# Patient Record
Sex: Male | Born: 1965 | Race: Black or African American | Hispanic: No | Smoking: Current every day smoker
Health system: Southern US, Community
[De-identification: ages and names within clinical notes are randomized; demographics above are authoritative.]

## PROBLEM LIST (undated history)

## (undated) DIAGNOSIS — I10 Essential (primary) hypertension: Secondary | ICD-10-CM

## (undated) HISTORY — DX: Essential (primary) hypertension: I10

## (undated) HISTORY — PX: ARM DEBRIDEMENT: SHX890

---

## 1999-06-06 ENCOUNTER — Encounter: Payer: Self-pay | Admitting: Emergency Medicine

## 1999-06-06 ENCOUNTER — Emergency Department (HOSPITAL_COMMUNITY): Admission: EM | Admit: 1999-06-06 | Discharge: 1999-06-06 | Payer: Self-pay | Admitting: Emergency Medicine

## 2007-04-15 ENCOUNTER — Emergency Department (HOSPITAL_COMMUNITY): Admission: EM | Admit: 2007-04-15 | Discharge: 2007-04-16 | Payer: Self-pay | Admitting: Emergency Medicine

## 2012-01-07 ENCOUNTER — Ambulatory Visit (INDEPENDENT_AMBULATORY_CARE_PROVIDER_SITE_OTHER): Payer: BC Managed Care – PPO

## 2012-01-07 DIAGNOSIS — R1031 Right lower quadrant pain: Secondary | ICD-10-CM

## 2012-01-07 DIAGNOSIS — R1110 Vomiting, unspecified: Secondary | ICD-10-CM

## 2012-12-21 ENCOUNTER — Emergency Department (HOSPITAL_COMMUNITY)
Admission: EM | Admit: 2012-12-21 | Discharge: 2012-12-21 | Disposition: A | Discharge status: Home / Self Care | Payer: BC Managed Care – PPO | Attending: Emergency Medicine | Admitting: Emergency Medicine

## 2012-12-21 ENCOUNTER — Emergency Department (HOSPITAL_COMMUNITY): Payer: BC Managed Care – PPO

## 2012-12-21 ENCOUNTER — Encounter (HOSPITAL_COMMUNITY): Payer: Self-pay | Admitting: Emergency Medicine

## 2012-12-21 DIAGNOSIS — N201 Calculus of ureter: Secondary | ICD-10-CM | POA: Insufficient documentation

## 2012-12-21 DIAGNOSIS — R1031 Right lower quadrant pain: Secondary | ICD-10-CM | POA: Insufficient documentation

## 2012-12-21 DIAGNOSIS — Z79899 Other long term (current) drug therapy: Secondary | ICD-10-CM | POA: Insufficient documentation

## 2012-12-21 DIAGNOSIS — M545 Low back pain, unspecified: Secondary | ICD-10-CM | POA: Insufficient documentation

## 2012-12-21 DIAGNOSIS — F17200 Nicotine dependence, unspecified, uncomplicated: Secondary | ICD-10-CM | POA: Insufficient documentation

## 2012-12-21 DIAGNOSIS — R109 Unspecified abdominal pain: Secondary | ICD-10-CM

## 2012-12-21 LAB — CBC
HCT: 45.3 % (ref 39.0–52.0)
Hemoglobin: 16 g/dL (ref 13.0–17.0)
MCH: 30.9 pg (ref 26.0–34.0)
MCHC: 35.3 g/dL (ref 30.0–36.0)
MCV: 87.6 fL (ref 78.0–100.0)
Platelets: 152 10*3/uL (ref 150–400)
RBC: 5.17 MIL/uL (ref 4.22–5.81)
RDW: 12.4 % (ref 11.5–15.5)
WBC: 7.3 10*3/uL (ref 4.0–10.5)

## 2012-12-21 LAB — BASIC METABOLIC PANEL
BUN: 14 mg/dL (ref 6–23)
CO2: 30 mEq/L (ref 19–32)
Calcium: 9.5 mg/dL (ref 8.4–10.5)
Chloride: 104 mEq/L (ref 96–112)
Creatinine, Ser: 1.24 mg/dL (ref 0.50–1.35)
GFR calc Af Amer: 79 mL/min — ABNORMAL LOW (ref >90)
GFR calc non Af Amer: 68 mL/min — ABNORMAL LOW (ref >90)
Glucose, Bld: 119 mg/dL — ABNORMAL HIGH (ref 70–99)
Potassium: 3.8 mEq/L (ref 3.5–5.1)
Sodium: 141 mEq/L (ref 135–145)

## 2012-12-21 MED ORDER — KETOROLAC TROMETHAMINE 15 MG/ML IJ SOLN
15.0000 mg | Freq: Once | INTRAMUSCULAR | Status: DC
  Filled 2012-12-21: qty 1

## 2012-12-21 MED ORDER — HYDROMORPHONE HCL PF 1 MG/ML IJ SOLN
1.0000 mg | Freq: Once | INTRAMUSCULAR | Status: AC
  Administered 2012-12-21: 1 mg via INTRAVENOUS
  Filled 2012-12-21: qty 1

## 2012-12-21 MED ORDER — SODIUM CHLORIDE 0.9 % IV BOLUS (SEPSIS)
1000.0000 mL | Freq: Once | INTRAVENOUS | Status: AC
  Administered 2012-12-21: 1000 mL via INTRAVENOUS

## 2012-12-21 MED ORDER — ONDANSETRON HCL 4 MG/2ML IJ SOLN
4.0000 mg | Freq: Once | INTRAMUSCULAR | Status: AC
  Administered 2012-12-21: 4 mg via INTRAVENOUS
  Filled 2012-12-21: qty 2

## 2012-12-21 MED ORDER — OXYCODONE-ACETAMINOPHEN 5-325 MG PO TABS: 1.0000 | ORAL_TABLET | ORAL | Status: DC | PRN

## 2012-12-21 MED ORDER — TAMSULOSIN HCL 0.4 MG PO CAPS: 0.4000 mg | ORAL_CAPSULE | Freq: Every day | ORAL | Status: DC

## 2012-12-21 NOTE — ED Notes (Signed)
Patient transported to CT 

## 2012-12-21 NOTE — ED Provider Notes (Signed)
History    46 year old male with abdominal pain. Patient noticed right lower back pain when he woke up this morning around 0400. Progress to his right lower quadrant. Patient describes the pain as it as someone is stabbing him. Initially very severe and associated with diaphoresis. Currently feels better but still has some mild ache in his right or quadrant. No appreciable exacerbating relieving factors. No testicular pain or swelling. No urinary complaints. No history similar symptoms. No history of abdominal surgery. No fevers or chills. Patient went to bed last night he was in his usual state of health.  CSN: [id number]  Arrival date & time 12/21/12  0901   First MD Initiated Contact with Patient 12/21/12 [phone]      Chief Complaint  Patient presents with  . Abdominal Pain    (Consider location/radiation/quality/duration/timing/severity/associated sxs/prior treatment) HPI  History reviewed. No pertinent past medical history.  History reviewed. No pertinent past surgical history.  No family history on file.  History  Substance Use Topics  . Smoking status: Current Every Day Smoker  . Smokeless tobacco: Not on file  . Alcohol Use: Yes     Comment: ocassionally       Review of Systems  All systems reviewed and negative, other than as noted in HPI.   Allergies  Review of patient's allergies indicates no known allergies.  Home Medications   Current Outpatient Rx  Name  Route  Sig  Dispense  Refill  . L-ARGININE PO   Oral   Take 1 tablet by mouth daily.         Marland Kitchen LISINOPRIL-HYDROCHLOROTHIAZIDE 20-12.5 MG PO TABS   Oral   Take 1 tablet by mouth daily.         . ADULT MULTIVITAMIN W/MINERALS CH   Oral   Take 1 tablet by mouth daily.         Marland Kitchen NAPROXEN SODIUM 220 MG PO TABS   Oral   Take 660 mg by mouth 2 (two) times daily as needed. For pain.           BP 133/77  Pulse 64  Temp 98.4 F (36.9 C) (Oral)  Resp 18  SpO2 99%  Physical Exam  Nursing  note and vitals reviewed. Constitutional: He appears well-developed and well-nourished. No distress.       Laying in bed. No acute distress  HENT:  Head: Normocephalic and atraumatic.  Eyes: Conjunctivae normal are normal. Right eye exhibits no discharge. Left eye exhibits no discharge.  Neck: Neck supple.  Cardiovascular: Normal rate, regular rhythm and normal heart sounds.  Exam reveals no gallop and no friction rub.   No murmur heard. Pulmonary/Chest: Effort normal and breath sounds normal. No respiratory distress.  Abdominal: Soft. He exhibits no distension. There is tenderness.       Abdomen normal to inspection. Mild tenderness in the right lower quadrant without rebound or guarding. No distention  Genitourinary:       No costovertebral angle tenderness  Musculoskeletal: He exhibits no edema and no tenderness.  Neurological: He is alert.  Skin: Skin is warm and dry.  Psychiatric: He has a normal mood and affect. His behavior is normal. Thought content normal.    ED Course  Procedures (including critical care time)   Labs Reviewed  CBC  URINALYSIS, ROUTINE W REFLEX MICROSCOPIC  BASIC METABOLIC PANEL   Ct Abdomen Pelvis Wo Contrast  12/21/2012  *RADIOLOGY REPORT*  Clinical Data: Right flank pain, right lower quadrant pain  CT ABDOMEN  AND PELVIS WITHOUT CONTRAST  Technique:  Multidetector CT imaging of the abdomen and pelvis was performed following the standard protocol without intravenous contrast.  Comparison: None.  Findings: Sagittal images of the spine shows no destructive bony lesions.  Lung bases are unremarkable.  Unenhanced liver shows no biliary ductal dilatation.  No calcified gallstones are noted within gallbladder.  Unenhanced pancreas, spleen and adrenal glands are unremarkable.  Unenhanced kidneys are symmetrical in size.  There is subtle mild right perinephric stranding.  No hydronephrosis or hydroureter.  No proximal or mid calcified ureteral calculi are noted.  No  aortic aneurysm.  No small bowel obstruction.  No ascites or free air.  No adenopathy.  No pericecal inflammation.  Normal appendix is clearly visualized axial image 56.  There is mild distended distal right ureter.  In axial image 78 there is 2 mm calcified calculus in the right UVJ.  Prostate gland calcifications are noted.  Prostate gland measures 2.6 x 4 cm.  No destructive bony lesions are noted within pelvis.  No calcified calculi are noted within urinary bladder.  IMPRESSION:  1.  No nephrolithiasis. 2.  Subtle mild right perinephric stranding.  There is 2 mm calcified calculus in the right UVJ. 3.  No pericecal inflammation.  Normal appendix. 4.  No small bowel obstruction.   Original Report Authenticated By: Natasha Mead, M.D.      1. Ureteral calculus   2. Abdominal pain       MDM  46 year old male with right-sided abdominal pain. History provided is consistent with a ureteral stone. Patient does have some tenderness in his right lower quadrant though. Consider appendicitis. Pain CT abdomen and pelvis.BAsic labs and urinalysis. Will treat symptoms and reassess.  CT scan shows a 2 mm right ureteral stone at the UVJ. Patient reports symptoms are much improved. Repeat abdominal exam prior to discharge shows no tenderness. Patient is afebrile. He has not been vomiting. Renal function is normal. Is appropriate for discharge at this time. Emergent return precautions were discussed. Outpatient followup otherwise        Raeford Razor, MD 12/21/12 1038

## 2012-12-21 NOTE — ED Notes (Signed)
Per EMS: Woke up around 0400  Sharp right lower quadrant abdominal pain, denies n/v/d.

## 2012-12-21 NOTE — ED Notes (Signed)
RN to obtain labs with start of IV 

## 2012-12-21 NOTE — ED Notes (Signed)
WUJ:[id number]<JY> Expected date:12/21/12<BR> Expected time: 8:45 AM<BR> Means of arrival:Ambulance<BR> Comments:<BR> abd pain 4 hours

## 2012-12-21 NOTE — ED Notes (Signed)
Pt states pain has decreased since this morning, now its more of a discomforting pain. Denies n/v/d or constipation. Rates pain 2/10.

## 2013-04-23 ENCOUNTER — Other Ambulatory Visit: Payer: Self-pay | Admitting: Family Medicine

## 2013-07-25 ENCOUNTER — Encounter: Payer: Self-pay | Admitting: *Deleted

## 2013-07-27 ENCOUNTER — Ambulatory Visit (INDEPENDENT_AMBULATORY_CARE_PROVIDER_SITE_OTHER): Payer: BC Managed Care – PPO | Admitting: Family Medicine

## 2013-07-27 ENCOUNTER — Encounter: Payer: Self-pay | Admitting: Family Medicine

## 2013-07-27 VITALS — BP 122/80 | Temp 98.7°F | Wt 204.0 lb

## 2013-07-27 DIAGNOSIS — I10 Essential (primary) hypertension: Secondary | ICD-10-CM

## 2013-07-27 DIAGNOSIS — Z0000 Encounter for general adult medical examination without abnormal findings: Secondary | ICD-10-CM

## 2013-07-27 DIAGNOSIS — R109 Unspecified abdominal pain: Secondary | ICD-10-CM

## 2013-07-27 LAB — POCT URINALYSIS DIPSTICK
Spec Grav, UA: 1.025
pH, UA: 5

## 2013-07-27 MED ORDER — METRONIDAZOLE 500 MG PO TABS: ORAL_TABLET | ORAL | Status: AC

## 2013-07-27 MED ORDER — LISINOPRIL-HYDROCHLOROTHIAZIDE 20-12.5 MG PO TABS: ORAL_TABLET | ORAL | Status: DC

## 2013-07-27 NOTE — Progress Notes (Signed)
  Subjective:    Patient ID: Fred Weaver, male    DOB: July 27, 1966, 47 y.o.   MRN: [id number]  HPIHere for a blood pressure check up. Patient denies any chest tightness pressure pain shortness breath high fever chills vomiting or diarrhea. He does state that his partner who he's been with for several years recently tested positive for trichomoniasis so he is here to get checked for that. He is under some stress. He still working doing well with that.  PMH benign social, does not smoke   Review of Systems see above They states that he gets HIV testing and STD testing elsewhere.    Objective:   Physical Exam  Lungs are clear hearts regular pulse normal blood pressure is good abdomen soft genital exam normal      Assessment & Plan:  No sign of trichomoniasis he does relate that he would like to go ahead and be treated Flagyl 500 mg 4 tablets by mouth times one  Patient is due for cholesterol screening as well as a PSA  Hypertension good control prescription with refills given followup 1 year or sooner problems exercise diet measures discussed

## 2014-04-20 ENCOUNTER — Encounter: Payer: Self-pay | Admitting: Family Medicine

## 2014-04-20 ENCOUNTER — Ambulatory Visit (INDEPENDENT_AMBULATORY_CARE_PROVIDER_SITE_OTHER): Payer: BC Managed Care – PPO | Admitting: Family Medicine

## 2014-04-20 VITALS — BP 122/70 | Ht 72.0 in | Wt 211.8 lb

## 2014-04-20 DIAGNOSIS — Z13220 Encounter for screening for lipoid disorders: Secondary | ICD-10-CM

## 2014-04-20 DIAGNOSIS — Z8042 Family history of malignant neoplasm of prostate: Secondary | ICD-10-CM

## 2014-04-20 DIAGNOSIS — I10 Essential (primary) hypertension: Secondary | ICD-10-CM

## 2014-04-20 MED ORDER — LISINOPRIL-HYDROCHLOROTHIAZIDE 20-12.5 MG PO TABS: ORAL_TABLET | ORAL | Status: DC

## 2014-04-20 NOTE — Progress Notes (Signed)
   Subjective:    Patient ID: Fred Weaver, male    DOB: 1966-10-01, 48 y.o.   MRN: [id number]  Hypertension This is a chronic problem. The current episode started more than 1 year ago. The problem is unchanged. Pertinent negatives include no anxiety, chest pain, headaches or shortness of breath. There are no associated agents to hypertension. Risk factors for coronary artery disease include smoking/tobacco exposure. The current treatment provides moderate improvement. There are no compliance problems.    Patient arrives for a follow up on blood pressure. Patient reports no problems or concerns.   Review of Systems  Constitutional: Negative for fever and activity change.  HENT: Negative for congestion, ear pain and rhinorrhea.   Eyes: Negative for discharge.  Respiratory: Negative for cough, shortness of breath and wheezing.   Cardiovascular: Negative for chest pain.  Neurological: Negative for headaches.       Objective:   Physical Exam  Vitals reviewed. Constitutional: He appears well-nourished. No distress.  Cardiovascular: Normal rate, regular rhythm and normal heart sounds.   No murmur heard. Pulmonary/Chest: Effort normal and breath sounds normal. No respiratory distress.  Musculoskeletal: He exhibits no edema.  Lymphadenopathy:    He has no cervical adenopathy.  Neurological: He is alert.  Psychiatric: His behavior is normal.          Assessment & Plan:  HTN good control continue current measures check blood pressure periodically as an outpatient also do lab work as directed. Followup in 48 year old going well. Proper diet quitting smoking all discussed

## 2014-04-21 ENCOUNTER — Encounter: Payer: Self-pay | Admitting: Family Medicine

## 2014-04-21 LAB — BASIC METABOLIC PANEL
BUN: 11 mg/dL (ref 6–23)
CHLORIDE: 102 meq/L (ref 96–112)
CO2: 32 meq/L (ref 19–32)
CREATININE: 1 mg/dL (ref 0.50–1.35)
Calcium: 9.4 mg/dL (ref 8.4–10.5)
Glucose, Bld: 89 mg/dL (ref 70–99)
POTASSIUM: 4.1 meq/L (ref 3.5–5.3)
Sodium: 141 mEq/L (ref 135–145)

## 2014-04-21 LAB — LIPID PANEL
CHOLESTEROL: 145 mg/dL (ref 0–200)
HDL: 38 mg/dL — ABNORMAL LOW (ref >39)
LDL Cholesterol: 83 mg/dL (ref 0–99)
TRIGLYCERIDES: 121 mg/dL (ref <150)
Total CHOL/HDL Ratio: 3.8 Ratio
VLDL: 24 mg/dL (ref 0–40)

## 2014-04-21 LAB — PSA: PSA: 1.48 ng/mL (ref <=4.00)

## 2014-07-18 ENCOUNTER — Ambulatory Visit (INDEPENDENT_AMBULATORY_CARE_PROVIDER_SITE_OTHER): Payer: BC Managed Care – PPO | Admitting: Family Medicine

## 2014-07-18 VITALS — BP 118/70 | HR 96 | Temp 98.6°F | Resp 20 | Ht 71.0 in | Wt 199.4 lb

## 2014-07-18 DIAGNOSIS — T7840XA Allergy, unspecified, initial encounter: Secondary | ICD-10-CM

## 2014-07-18 MED ORDER — LOSARTAN POTASSIUM-HCTZ 100-12.5 MG PO TABS: 1.0000 | ORAL_TABLET | Freq: Every day | ORAL | Status: DC

## 2014-07-18 MED ORDER — PREDNISONE 20 MG PO TABS: 40.0000 mg | ORAL_TABLET | Freq: Every day | ORAL | Status: DC

## 2014-07-18 NOTE — Patient Instructions (Signed)

## 2014-07-18 NOTE — Progress Notes (Signed)
This chart was scribed for Fred Haber, MD by Elby Beck, Scribe. This patient was seen in room 10 and the patient's care was started at 1:02 PM.  @UMFCLOGO @  Patient ID: Fred Weaver MRN: [id number], DOB: Aug 04, 1966, 48 y.o. Date of Encounter: 07/18/2014, 1:06 PM  Primary Physician: Sallee Lange, MD  Chief Complaint:   HPI: 48 y.o. year old male with history below presents with an itchy rash to his bilateral arms and hands. He states that he woke up with the rash yesterday morning.  He reports associated swelling to his left hand and swelling surrounding his left eye. He states that he has recently taken a new medication for STD treatment. He states that he took a 7-day course of the medication, and that he finished the medication 3 days ago. He states that he has had similar rash on one prior occasion in the past. He is taking Lisinopril as prescribed. He denies any cough or SOB. He states that he did notice having some chest pain briefly yesterday.   Past Medical History  Diagnosis Date  . Hypertension      Home Meds: Prior to Admission medications   Medication Sig Start Date End Date Taking? Authorizing Provider  lisinopril-hydrochlorothiazide (PRINZIDE,ZESTORETIC) 20-12.5 MG per tablet TAKE ONE TABLET BY MOUTH IN THE MORNING 04/20/14  Yes Kathyrn Drown, MD  Multiple Vitamin (MULTIVITAMIN WITH MINERALS) TABS Take 1 tablet by mouth daily.   Yes Historical Provider, MD    Allergies: No Known Allergies  History   Social History  . Marital Status: Divorced    Spouse Name: N/A    Number of Children: N/A  . Years of Education: N/A   Occupational History  . Not on file.   Social History Main Topics  . Smoking status: Current Some Day Smoker  . Smokeless tobacco: Former Systems developer    Quit date: 04/27/2013  . Alcohol Use: Yes     Comment: ocassionally   . Drug Use: No  . Sexual Activity: Not on file   Other Topics Concern  . Not on file   Social History Narrative  .  No narrative on file     Review of Systems Constitutional: negative for chills, fever, night sweats, weight changes, or fatigue  HEENT: negative for vision changes, hearing loss, congestion, rhinorrhea, ST, epistaxis, or sinus pressure Cardiovascular: negative for palpitations Respiratory: negative for hemoptysis, wheezing, shortness of breath, or cough Abdominal: negative for abdominal pain, nausea, vomiting, diarrhea, or constipation Dermatological: +rash Neurologic: negative for headache, dizziness, or syncope All other systems reviewed and are otherwise negative with the exception to those above and in the HPI.   Physical Exam: Blood pressure 118/70, pulse 96, temperature 98.6 F (37 C), temperature source Oral, resp. rate 20, height 5\' 11"  (1.803 m), weight 199 lb 6 oz (90.436 kg), SpO2 97.00%., Body mass index is 27.82 kg/(m^2). General: Well developed, well nourished, in no acute distress. Head: Normocephalic, atraumatic, eyes without discharge, sclera non-icteric, nares are without discharge. Bilateral auditory canals clear, TM's are without perforation, pearly grey and translucent with reflective cone of light bilaterally. Oral cavity moist, posterior pharynx without exudate, erythema, peritonsillar abscess, or post nasal drip.  Neck: Supple. No thyromegaly. Full ROM. No lymphadenopathy. Lungs: Clear bilaterally to auscultation without wheezes, rales, or rhonchi. Breathing is unlabored. Heart: RRR with S1 S2. No murmurs, rubs, or gallops appreciated. Abdomen: Soft, non-tender, non-distended with normoactive bowel sounds. No hepatomegaly. No rebound/guarding. No obvious abdominal masses. Msk:  Strength and tone normal for  age. Extremities/Skin: Warm and dry. No clubbing or cyanosis. No edema. No rashes or suspicious lesions.  Patient's left paravertebral region is mildly swollen, his right arm and hand are mildly swollen around the wrist Neuro: Alert and oriented X 3. Moves all  extremities spontaneously. Gait is normal. CNII-XII grossly in tact. Psych:  Responds to questions appropriately with a normal affect.    ASSESSMENT AND PLAN:  48 y.o. year old male with Allergic reaction, initial encounter - Plan: losartan-hydrochlorothiazide (HYZAAR) 100-12.5 MG per tablet, predniSONE (DELTASONE) 20 MG tablet   I am suspicious the patient may have been the lisinopril reaction because this has happened before and because the areas of swelling are so separated and involved the face  Signed, Fred Haber, MD 07/18/2014 1:06 PM

## 2014-08-17 IMAGING — CT CT ABD-PELV W/O CM
1 series · 15 of 24 positions shown, 19 images · non-contrast
Comparison: None.

CLINICAL DATA: Right flank pain, right lower quadrant pain

CT ABDOMEN AND PELVIS WITHOUT CONTRAST
TECHNIQUE: Multidetector CT imaging of the abdomen and pelvis was
performed following the standard protocol without intravenous
contrast.

[Series 6: lung · axial · 0.74mm/px · z∈[-161,-56]mm · 15 of 24 slices shown, 19 images]
[im 2/24  soft-tissue]
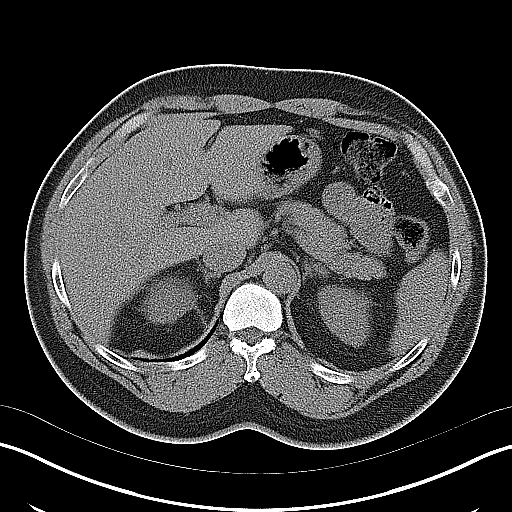
[im 2/24  bone]
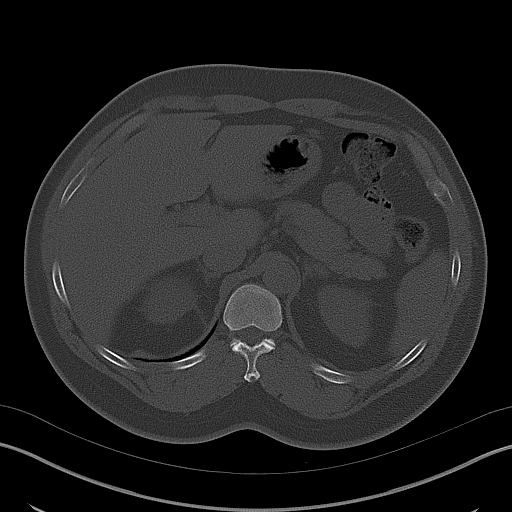
[im 4/24  soft-tissue]
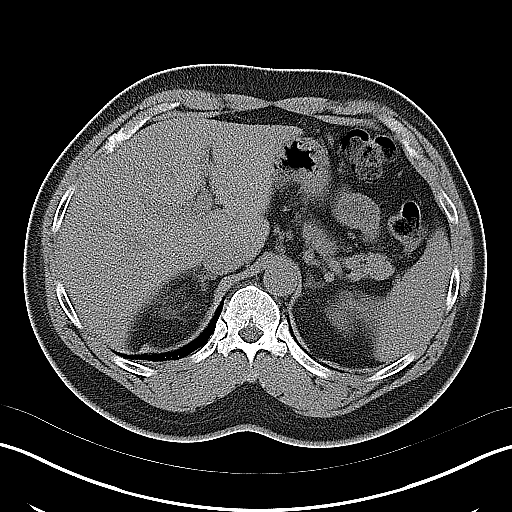
[im 6/24  soft-tissue]
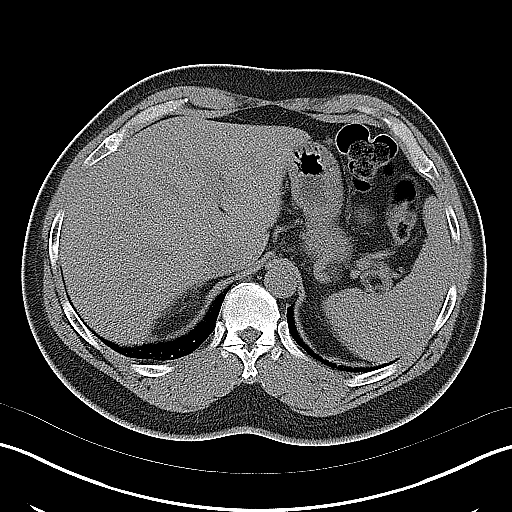
[im 7/24  soft-tissue]
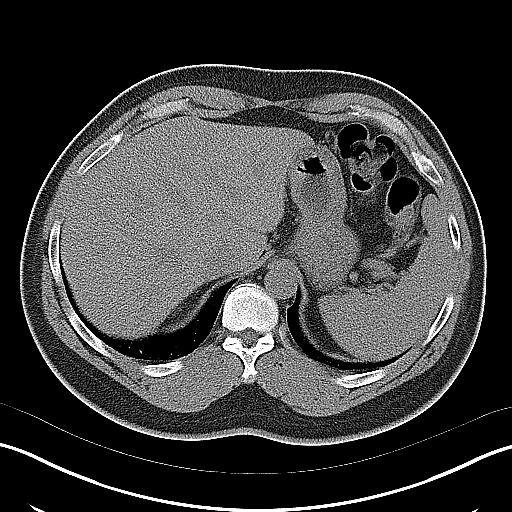
[im 9/24  soft-tissue]
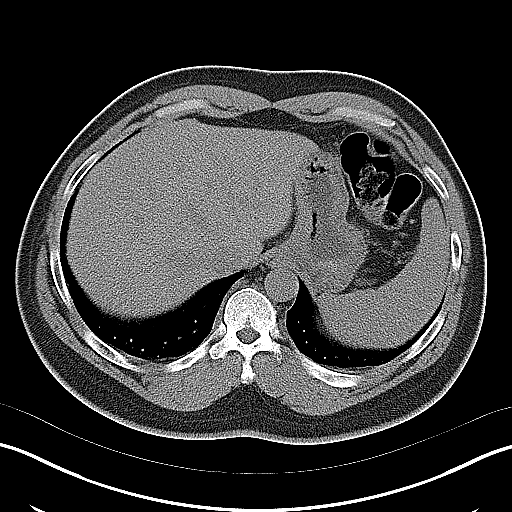
[im 11/24  soft-tissue]
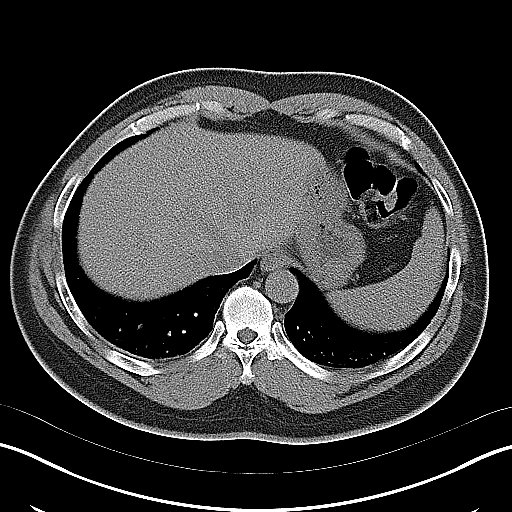
[im 13/24  soft-tissue]
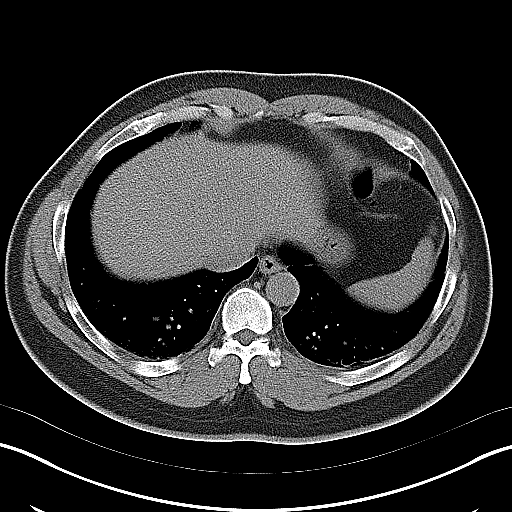
[im 14/24  soft-tissue]
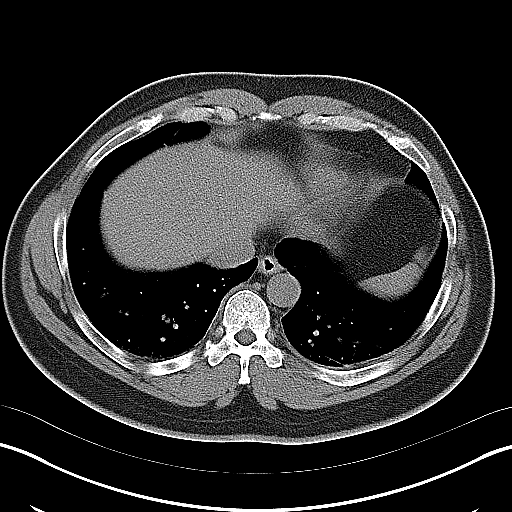
[im 16/24  soft-tissue]
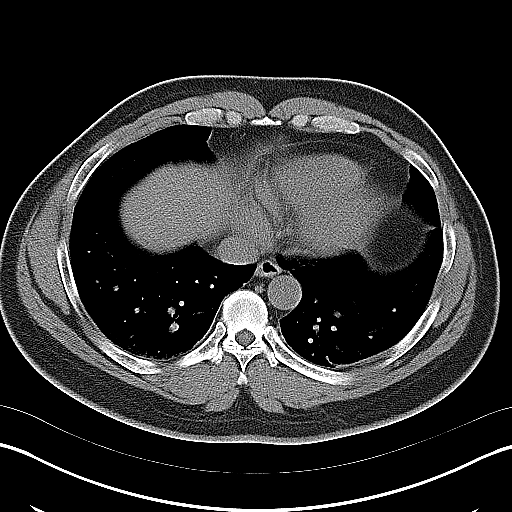
[im 16/24  bone]
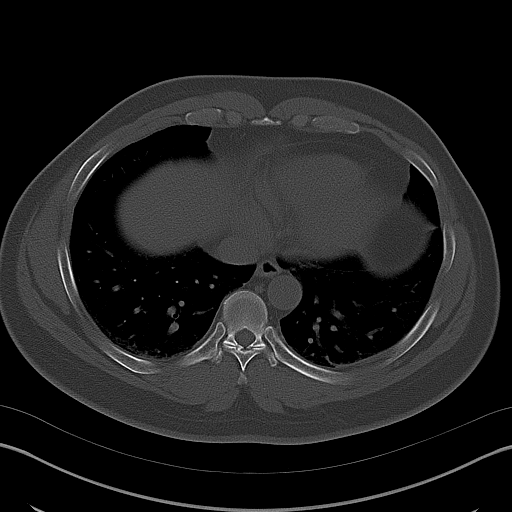
[im 18/24  soft-tissue]
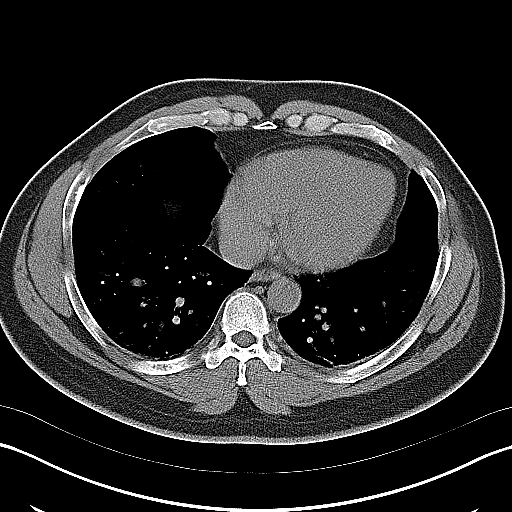
[im 19/24  soft-tissue]
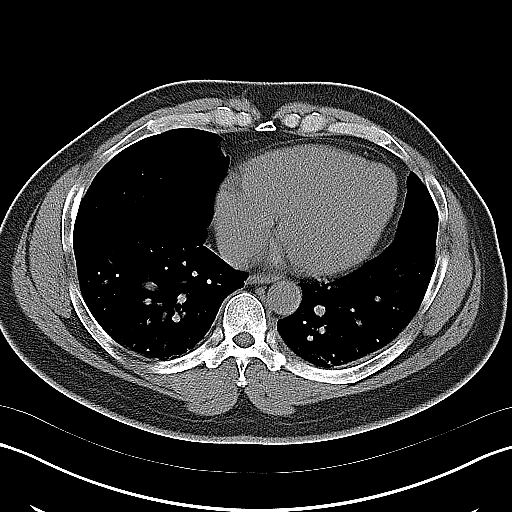
[im 20/24  lung]
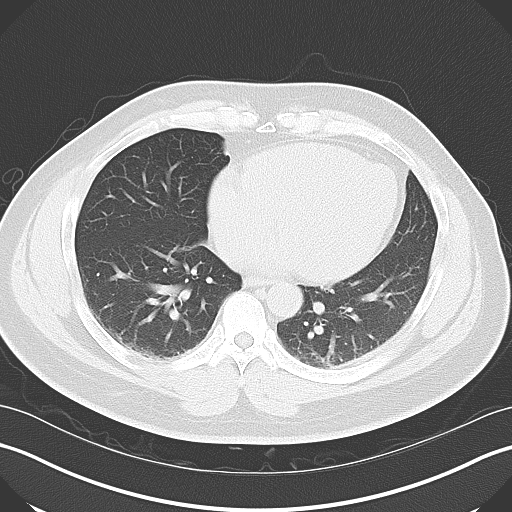
[im 21/24  soft-tissue]
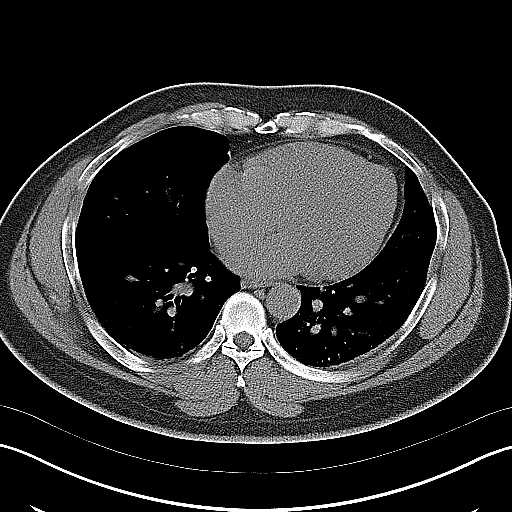
[im 21/24  lung]
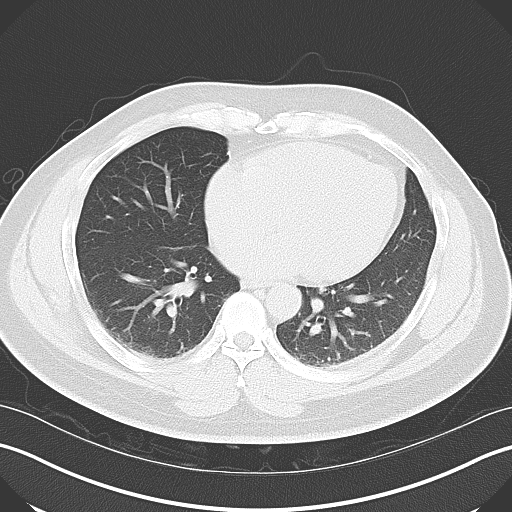
[im 22/24  lung]
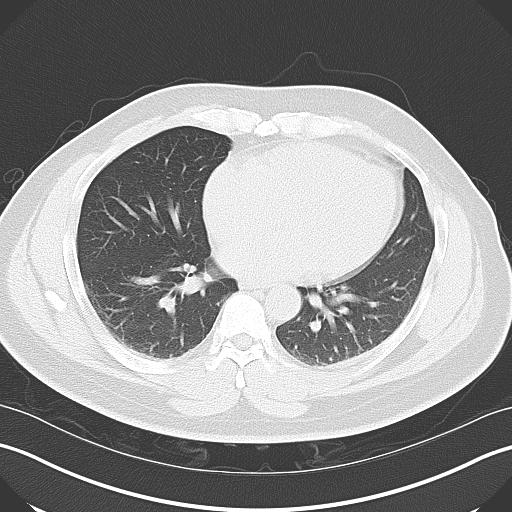
[im 23/24  soft-tissue]
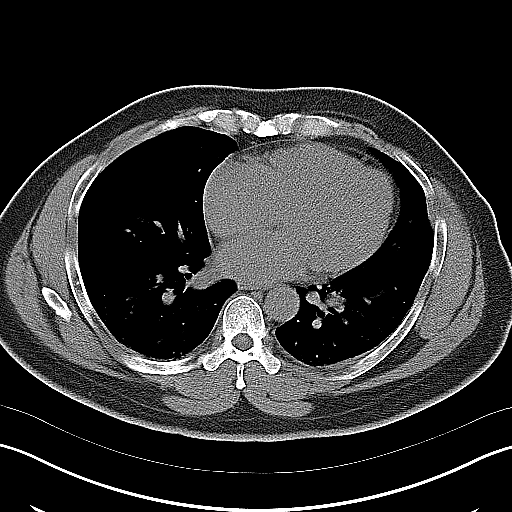
[im 23/24  lung]
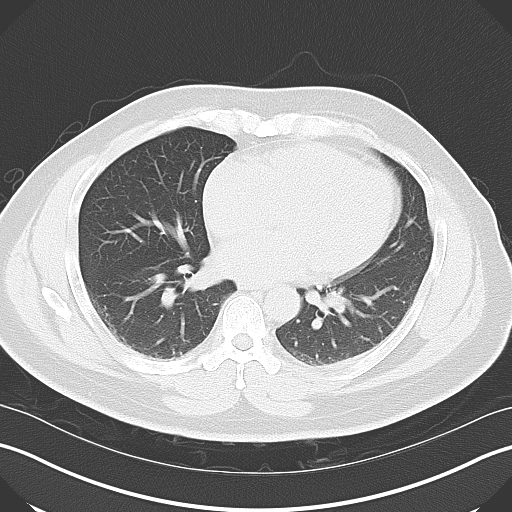

[15 of 24 positions shown; findings below may reference images not displayed]

FINDINGS: Sagittal images of the spine shows no destructive bony
lesions.

Lung bases are unremarkable.  Unenhanced liver shows no biliary
ductal dilatation.  No calcified gallstones are noted within
gallbladder.  Unenhanced pancreas, spleen and adrenal glands are
unremarkable.

Unenhanced kidneys are symmetrical in size.  There is subtle mild
right perinephric stranding.  No hydronephrosis or hydroureter.

No proximal or mid calcified ureteral calculi are noted.

No aortic aneurysm.

No small bowel obstruction.  No ascites or free air.  No
adenopathy.  No pericecal inflammation.  Normal appendix is clearly
visualized axial image 56.

There is mild distended distal right ureter.  In axial image 78
there is 2 mm calcified calculus in the right UVJ.  Prostate gland
calcifications are noted.  Prostate gland measures 2.6 x 4 cm.  No
destructive bony lesions are noted within pelvis.  No calcified
calculi are noted within urinary bladder.
IMPRESSION: 1.  No nephrolithiasis.
2.  Subtle mild right perinephric stranding.  There is 2 mm
calcified calculus in the right UVJ.
3.  No pericecal inflammation.  Normal appendix.
4.  No small bowel obstruction.

## 2015-04-21 ENCOUNTER — Telehealth: Payer: Self-pay | Admitting: *Deleted

## 2015-04-21 DIAGNOSIS — T7840XA Allergy, unspecified, initial encounter: Secondary | ICD-10-CM

## 2015-04-21 NOTE — Telephone Encounter (Signed)
Fax from [facility] [address] requesting lisinop - hctz 20-12.5. Last filled 01/18/15 for a 90 day supply. Med on med list is losartan - hctz prescribed by dr. Joseph Art. Pt last seen 04/21/15

## 2015-04-21 NOTE — Telephone Encounter (Signed)
Based on the information from the July visit I would not recommend lisinopril. Losartan diuretic that was prescribed may be renewed for 30 days with one refill with the message needs office visit

## 2015-04-22 MED ORDER — LOSARTAN POTASSIUM-HCTZ 100-12.5 MG PO TABS
1.0000 | ORAL_TABLET | Freq: Every day | ORAL | Status: DC
Start: 2015-04-22 — End: 2015-04-26

## 2015-04-22 NOTE — Telephone Encounter (Signed)
Done

## 2015-04-26 ENCOUNTER — Ambulatory Visit (INDEPENDENT_AMBULATORY_CARE_PROVIDER_SITE_OTHER): Payer: BLUE CROSS/BLUE SHIELD | Admitting: Family Medicine

## 2015-04-26 ENCOUNTER — Encounter: Payer: Self-pay | Admitting: Family Medicine

## 2015-04-26 VITALS — BP 138/82 | Ht 72.0 in | Wt 203.0 lb

## 2015-04-26 DIAGNOSIS — Z79899 Other long term (current) drug therapy: Secondary | ICD-10-CM

## 2015-04-26 DIAGNOSIS — Z13220 Encounter for screening for lipoid disorders: Secondary | ICD-10-CM

## 2015-04-26 DIAGNOSIS — I10 Essential (primary) hypertension: Secondary | ICD-10-CM | POA: Diagnosis not present

## 2015-04-26 DIAGNOSIS — Z125 Encounter for screening for malignant neoplasm of prostate: Secondary | ICD-10-CM

## 2015-04-26 MED ORDER — LISINOPRIL-HYDROCHLOROTHIAZIDE 20-12.5 MG PO TABS: 1.0000 | ORAL_TABLET | Freq: Every day | ORAL | Status: DC

## 2015-04-26 NOTE — Patient Instructions (Addendum)
Dear Patient,  It has been recommended to you that you have a colonoscopy. It is your responsibility to carry through with this recommendation.   Did you realize that colon cancer is the second leading cancer killer in the [place]. One in every 20 adults will get colon cancer. If all adults would go through the recommended screening for colon cancer (getting a colonoscopy), then there would be a 60% reduction in the number of people dying from colon cancer.  Colon cancer just doesn't come out of the blue. It starts off as a small polyp which over time grows into a cancer. A colonoscopy can prevent cancer and in many cases detected when it is at a very treatable phase. Small colon cancers can have cure rates of 95%. Advanced colon cancer, which often occurs in people who do not do their screenings, have cure rates less than 20%. The risk of colon cancer advances with age. Most adults should have regular colonoscopies every 10 years starting at age 50. This recommendation can vary depending on a person's medical history.  Health-care laws now allow for you to call the gastroenterologist office directly in order to set yourself up for this very important tests. Today we have recommended to you that you do this test. This test may save your life. Failure to do this test puts you at risk for premature death from colon cancer. Do the right thing and schedule this test now.  Here as a list of specialists we recommend in the surrounding area. When you call their office let them know that you are a patient of our practice in your interested in doing a screening colonoscopy. They should assist you without problems. You will need the following information when you called them: 1-name of which Dr. you see, 2-your insurance information, 3-a list of medications that you currently take, 4-any allergies you have to medications.  [place] gastroenterologist Dr. Milton Ferguson, Dr Felicie Morn  gastroenterologist   [facility]   [phone]  [facility] gastroenterology (Dr. Garnetta Buddy and [place]) [phone]  [facility] gastroenterology (Dr. Leia Alf, Harrietta Guardian, [place]) [phone]  Each group of specialists has assured [place] that when you called them they will help you get your colonoscopy set up. Should you have problems please let us know. Be sure to call soon. Sincerely, Pearson Forster, Dr Mickie Hillier, Dr.Deric Bocock      DASH Eating Plan DASH stands for "Dietary Approaches to Stop Hypertension." The DASH eating plan is a healthy eating plan that has been shown to reduce high blood pressure (hypertension). Additional health benefits may include reducing the risk of type 2 diabetes mellitus, heart disease, and stroke. The DASH eating plan may also help with weight loss. WHAT DO I NEED TO KNOW ABOUT THE DASH EATING PLAN? For the DASH eating plan, you will follow these general guidelines:  Choose foods with a percent daily value for sodium of less than 5% (as listed on the food label).  Use salt-free seasonings or herbs instead of table salt or sea salt.  Check with your health care provider or pharmacist before using salt substitutes.  Eat lower-sodium products, often labeled as "lower sodium" or "no salt added."  Eat fresh foods.  Eat more vegetables, fruits, and low-fat dairy products.  Choose whole grains. Look for the word "whole" as the first word in the ingredient list.  Choose fish and skinless chicken or  [place] more often than red meat. Limit fish, poultry, and meat to 6 oz (170 g) each day.  Limit sweets, desserts, sugars, and sugary drinks.  Choose heart-healthy fats.  Limit cheese to 1 oz (28 g) per day.  Eat more home-cooked food and less restaurant, buffet, and fast food.  Limit fried foods.  Cook foods using methods other  than frying.  Limit canned vegetables. If you do use them, rinse them well to decrease the sodium.  When eating at a restaurant, ask that your food be prepared with less salt, or no salt if possible. WHAT FOODS CAN I EAT? Seek help from a dietitian for individual calorie needs. Grains Whole grain or whole wheat bread. Brown rice. Whole grain or whole wheat pasta. Quinoa, bulgur, and whole grain cereals. Low-sodium cereals. Corn or whole wheat flour tortillas. Whole grain cornbread. Whole grain crackers. Low-sodium crackers. Vegetables Fresh or frozen vegetables (raw, steamed, roasted, or grilled). Low-sodium or reduced-sodium tomato and vegetable juices. Low-sodium or reduced-sodium tomato sauce and paste. Low-sodium or reduced-sodium canned vegetables.  Fruits All fresh, canned (in natural juice), or frozen fruits. Meat and Other Protein Products Ground beef (85% or leaner), grass-fed beef, or beef trimmed of fat. Skinless chicken or [place]. Ground chicken or [place]. Pork trimmed of fat. All fish and seafood. Eggs. Dried beans, peas, or lentils. Unsalted nuts and seeds. Unsalted canned beans. Dairy Low-fat dairy products, such as skim or 1% milk, 2% or reduced-fat cheeses, low-fat ricotta or cottage cheese, or plain low-fat yogurt. Low-sodium or reduced-sodium cheeses. Fats and Oils Tub margarines without trans fats. Light or reduced-fat mayonnaise and salad dressings (reduced sodium). Avocado. Safflower, olive, or canola oils. Natural peanut or almond butter. Other Unsalted popcorn and pretzels. The items listed above may not be a complete list of recommended foods or beverages. Contact your dietitian for more options. WHAT FOODS ARE NOT RECOMMENDED? Grains White bread. White pasta. White rice. Refined cornbread. Bagels and croissants. Crackers that contain trans fat. Vegetables Creamed or fried vegetables. Vegetables in a cheese sauce. Regular canned vegetables. Regular canned tomato  sauce and paste. Regular tomato and vegetable juices. Fruits Dried fruits. Canned fruit in light or heavy syrup. Fruit juice. Meat and Other Protein Products Fatty cuts of meat. Ribs, chicken wings, bacon, sausage, bologna, salami, chitterlings, fatback, hot dogs, bratwurst, and packaged luncheon meats. Salted nuts and seeds. Canned beans with salt. Dairy Whole or 2% milk, cream, half-and-half, and cream cheese. Whole-fat or sweetened yogurt. Full-fat cheeses or blue cheese. Nondairy creamers and whipped toppings. Processed cheese, cheese spreads, or cheese curds. Condiments Onion and garlic salt, seasoned salt, table salt, and sea salt. Canned and packaged gravies. Worcestershire sauce. Tartar sauce. Barbecue sauce. Teriyaki sauce. Soy sauce, including reduced sodium. Steak sauce. Fish sauce. Oyster sauce. Cocktail sauce. Horseradish. Ketchup and mustard. Meat flavorings and tenderizers. Bouillon cubes. Hot sauce. Tabasco sauce. Marinades. Taco seasonings. Relishes. Fats and Oils Butter, stick margarine, lard, shortening, ghee, and bacon fat. Coconut, palm kernel, or palm oils. Regular salad dressings. Other Pickles and olives. Salted popcorn and pretzels. The items listed above may not be a complete list of foods and beverages to avoid. Contact your dietitian for more information. WHERE CAN I FIND MORE INFORMATION? National Heart, Lung, and Blood Institute: travelstabloid.com Document Released: 12/06/2011 Document Revised: 05/03/2014 Document Reviewed: 10/21/2013 [facility] Patient Information 2015 [place], [place]. This information is not intended to replace advice given to you by your health care provider. Make sure you discuss any questions you have  with your health care provider.  

## 2015-04-26 NOTE — Progress Notes (Signed)
   Subjective:    Patient ID: Fred Weaver, male    DOB: [birth year], 49 y.o.   MRN: [id number]  Hypertension This is a chronic problem. The current episode started more than 1 year ago. The problem has been gradually improving since onset. The problem is controlled. Pertinent negatives include no chest pain. There are no associated agents to hypertension. There are no known risk factors for coronary artery disease. The current treatment provides significant improvement. There are no compliance problems.       Review of Systems  Constitutional: Negative for activity change, appetite change and fatigue.  HENT: Negative for congestion.   Respiratory: Negative for cough.   Cardiovascular: Negative for chest pain.  Gastrointestinal: Negative for abdominal pain.  Endocrine: Negative for polydipsia and polyphagia.  Neurological: Negative for weakness.  Psychiatric/Behavioral: Negative for confusion.       Objective:   Physical Exam  Constitutional: He appears well-nourished. No distress.  Cardiovascular: Normal rate, regular rhythm and normal heart sounds.   No murmur heard. Pulmonary/Chest: Effort normal and breath sounds normal. No respiratory distress.  Musculoskeletal: He exhibits no edema.  Lymphadenopathy:    He has no cervical adenopathy.  Neurological: He is alert.  Psychiatric: His behavior is normal.  Vitals reviewed.         Assessment & Plan:  HTN-refills on lisinopril/HCTZ given. Lab work ordered. Colonoscopy recommended follow-up in 1 year follow-up sooner problems  Patient assures me that he is not allergic to lisinopril that he had a rash on the days the urgent care doctor and they switched his medicine he states he was unaware of this and is been taking lisinopril ever since without difficulty therefore we removed this from the allergy list and restarted the lisinopril. If he has any rash problems swelling angioedema or other issues he is to follow-up  immediately

## 2016-04-19 ENCOUNTER — Other Ambulatory Visit: Payer: Self-pay | Admitting: *Deleted

## 2016-04-19 ENCOUNTER — Telehealth: Payer: Self-pay | Admitting: Family Medicine

## 2016-04-19 MED ORDER — LISINOPRIL-HYDROCHLOROTHIAZIDE 20-12.5 MG PO TABS: 1.0000 | ORAL_TABLET | Freq: Every day | ORAL | Status: DC

## 2016-04-19 NOTE — Telephone Encounter (Signed)
Med sent to pharm. Pt notified.  

## 2016-04-19 NOTE — Telephone Encounter (Signed)
Pt is needing a refill on his lisinopril to last to his appt.      [facility] [address]

## 2016-05-02 ENCOUNTER — Ambulatory Visit (INDEPENDENT_AMBULATORY_CARE_PROVIDER_SITE_OTHER): Payer: BLUE CROSS/BLUE SHIELD | Admitting: Family Medicine

## 2016-05-02 ENCOUNTER — Encounter: Payer: Self-pay | Admitting: Family Medicine

## 2016-05-02 VITALS — BP 112/80 | Ht 72.0 in | Wt 209.0 lb

## 2016-05-02 DIAGNOSIS — I10 Essential (primary) hypertension: Secondary | ICD-10-CM | POA: Diagnosis not present

## 2016-05-02 DIAGNOSIS — Z125 Encounter for screening for malignant neoplasm of prostate: Secondary | ICD-10-CM | POA: Diagnosis not present

## 2016-05-02 DIAGNOSIS — Z1211 Encounter for screening for malignant neoplasm of colon: Secondary | ICD-10-CM | POA: Diagnosis not present

## 2016-05-02 DIAGNOSIS — Z13220 Encounter for screening for lipoid disorders: Secondary | ICD-10-CM

## 2016-05-02 DIAGNOSIS — R5383 Other fatigue: Secondary | ICD-10-CM

## 2016-05-02 MED ORDER — LISINOPRIL-HYDROCHLOROTHIAZIDE 20-12.5 MG PO TABS: 1.0000 | ORAL_TABLET | Freq: Every day | ORAL | Status: DC

## 2016-05-02 NOTE — Progress Notes (Signed)
   Subjective:    Patient ID: Fred Weaver, male    DOB: Jan 21, 1966, 49 y.o.   MRN: [id number]  Hypertension This is a chronic problem. The current episode started more than 1 year ago. The problem has been gradually improving since onset. There are no associated agents to hypertension. There are no known risk factors for coronary artery disease. Treatments tried: lisinopril-hctz. The current treatment provides moderate improvement. There are no compliance problems.    Patient states that he feels very tired at times.  Patient denies chest tightness pressure pain shortness breath nausea vomiting diarrhea. Patient does try to eat healthy stays physically active. At times has some tiredness and fatigue but this is related to getting 6 and half hours sleep per night active job.     Review of Systems     Objective:   Physical Exam  Lungs clear hearts regular pulse normal BP rechecked and is good      Assessment & Plan:  HTN good control continue current measures recommend lab work. Will go ahead him include CBC.  I do recommend colonoscopy for the patient and PSA. We will check that today. Colonoscopy will be set up in [place].  Patient follow-up in one years time

## 2016-05-03 ENCOUNTER — Encounter: Payer: Self-pay | Admitting: Family Medicine

## 2016-05-08 ENCOUNTER — Encounter: Payer: Self-pay | Admitting: Family Medicine

## 2016-05-08 LAB — BASIC METABOLIC PANEL
BUN / CREAT RATIO: 11 (ref 9–20)
BUN: 12 mg/dL (ref 6–24)
CHLORIDE: 101 mmol/L (ref 96–106)
CO2: 29 mmol/L (ref 18–29)
CREATININE: 1.09 mg/dL (ref 0.76–1.27)
Calcium: 9.2 mg/dL (ref 8.7–10.2)
GFR calc Af Amer: 90 mL/min/{1.73_m2} (ref >59)
GFR calc non Af Amer: 78 mL/min/{1.73_m2} (ref >59)
GLUCOSE: 90 mg/dL (ref 65–99)
Potassium: 3.9 mmol/L (ref 3.5–5.2)
SODIUM: 144 mmol/L (ref 134–144)

## 2016-05-08 LAB — CBC WITH DIFFERENTIAL/PLATELET
Basophils Absolute: 0 10*3/uL (ref 0.0–0.2)
Basos: 1 %
EOS (ABSOLUTE): 0.3 10*3/uL (ref 0.0–0.4)
Eos: 7 %
Hematocrit: 49.8 % (ref 37.5–51.0)
Hemoglobin: 16.7 g/dL (ref 12.6–17.7)
Immature Grans (Abs): 0 10*3/uL (ref 0.0–0.1)
Immature Granulocytes: 0 %
Lymphocytes Absolute: 1.6 10*3/uL (ref 0.7–3.1)
Lymphs: 32 %
MCH: 30.7 pg (ref 26.6–33.0)
MCHC: 33.5 g/dL (ref 31.5–35.7)
MCV: 92 fL (ref 79–97)
Monocytes Absolute: 0.2 10*3/uL (ref 0.1–0.9)
Monocytes: 5 %
Neutrophils Absolute: 2.8 10*3/uL (ref 1.4–7.0)
Neutrophils: 55 %
Platelets: 192 10*3/uL (ref 150–379)
RBC: 5.44 x10E6/uL (ref 4.14–5.80)
RDW: 13 % (ref 12.3–15.4)
WBC: 5 10*3/uL (ref 3.4–10.8)

## 2016-05-08 LAB — LIPID PANEL
CHOL/HDL RATIO: 3.6 ratio (ref 0.0–5.0)
Cholesterol, Total: 144 mg/dL (ref 100–199)
HDL: 40 mg/dL (ref >39)
LDL Calculated: 81 mg/dL (ref 0–99)
Triglycerides: 113 mg/dL (ref 0–149)
VLDL Cholesterol Cal: 23 mg/dL (ref 5–40)

## 2016-05-08 LAB — PSA: PROSTATE SPECIFIC AG, SERUM: 1.2 ng/mL (ref 0.0–4.0)

## 2016-06-07 ENCOUNTER — Encounter: Payer: Self-pay | Admitting: Family Medicine

## 2017-05-16 ENCOUNTER — Encounter: Payer: Self-pay | Admitting: Family Medicine

## 2017-05-16 ENCOUNTER — Ambulatory Visit (INDEPENDENT_AMBULATORY_CARE_PROVIDER_SITE_OTHER): Payer: BLUE CROSS/BLUE SHIELD | Admitting: Family Medicine

## 2017-05-16 VITALS — BP 134/84 | Ht 72.0 in | Wt 204.4 lb

## 2017-05-16 DIAGNOSIS — I10 Essential (primary) hypertension: Secondary | ICD-10-CM

## 2017-05-16 DIAGNOSIS — Z125 Encounter for screening for malignant neoplasm of prostate: Secondary | ICD-10-CM | POA: Diagnosis not present

## 2017-05-16 DIAGNOSIS — R5383 Other fatigue: Secondary | ICD-10-CM

## 2017-05-16 DIAGNOSIS — Z1211 Encounter for screening for malignant neoplasm of colon: Secondary | ICD-10-CM

## 2017-05-16 DIAGNOSIS — Z13220 Encounter for screening for lipoid disorders: Secondary | ICD-10-CM

## 2017-05-16 MED ORDER — LISINOPRIL-HYDROCHLOROTHIAZIDE 20-12.5 MG PO TABS: 1.0000 | ORAL_TABLET | Freq: Every day | ORAL | 3 refills | Status: DC

## 2017-05-16 NOTE — Patient Instructions (Signed)
Steps to Quit Smoking Smoking tobacco can be harmful to your health and can affect almost every organ in your body. Smoking puts you, and those around you, at risk for developing many serious chronic diseases. Quitting smoking is difficult, but it is one of the best things that you can do for your health. It is never too late to quit. What are the benefits of quitting smoking? When you quit smoking, you lower your risk of developing serious diseases and conditions, such as:  Lung cancer or lung disease, such as COPD.  Heart disease.  Stroke.  Heart attack.  Infertility.  Osteoporosis and bone fractures.  Additionally, symptoms such as coughing, wheezing, and shortness of breath may get better when you quit. You may also find that you get sick less often because your body is stronger at fighting off colds and infections. If you are pregnant, quitting smoking can help to reduce your chances of having a baby of low birth weight. How do I get ready to quit? When you decide to quit smoking, create a plan to make sure that you are successful. Before you quit:  Pick a date to quit. Set a date within the next two weeks to give you time to prepare.  Write down the reasons why you are quitting. Keep this list in places where you will see it often, such as on your bathroom mirror or in your car or wallet.  Identify the people, places, things, and activities that make you want to smoke (triggers) and avoid them. Make sure to take these actions: ? Throw away all cigarettes at home, at work, and in your car. ? Throw away smoking accessories, such as ashtrays and lighters. ? Clean your car and make sure to empty the ashtray. ? Clean your home, including curtains and carpets.  Tell your family, friends, and coworkers that you are quitting. Support from your loved ones can make quitting easier.  Talk with your health care provider about your options for quitting smoking.  Find out what treatment  options are covered by your health insurance.  What strategies can I use to quit smoking? Talk with your healthcare provider about different strategies to quit smoking. Some strategies include:  Quitting smoking altogether instead of gradually lessening how much you smoke over a period of time. Research shows that quitting "cold turkey" is more successful than gradually quitting.  Attending in-person counseling to help you build problem-solving skills. You are more likely to have success in quitting if you attend several counseling sessions. Even short sessions of 10 minutes can be effective.  Finding resources and support systems that can help you to quit smoking and remain smoke-free after you quit. These resources are most helpful when you use them often. They can include: ? Online chats with a counselor. ? Telephone quitlines. ? Printed self-help materials. ? Support groups or group counseling. ? Text messaging programs. ? Mobile phone applications.  Taking medicines to help you quit smoking. (If you are pregnant or breastfeeding, talk with your health care provider first.) Some medicines contain nicotine and some do not. Both types of medicines help with cravings, but the medicines that include nicotine help to relieve withdrawal symptoms. Your health care provider may recommend: ? Nicotine patches, gum, or lozenges. ? Nicotine inhalers or sprays. ? Non-nicotine medicine that is taken by mouth.  Talk with your health care provider about combining strategies, such as taking medicines while you are also receiving in-person counseling. Using these two strategies together   makes you more likely to succeed in quitting than if you used either strategy on its own. If you are pregnant or breastfeeding, talk with your health care provider about finding counseling or other support strategies to quit smoking. Do not take medicine to help you quit smoking unless told to do so by your health care  provider. What things can I do to make it easier to quit? Quitting smoking might feel overwhelming at first, but there is a lot that you can do to make it easier. Take these important actions:  Reach out to your family and friends and ask that they support and encourage you during this time. Call telephone quitlines, reach out to support groups, or work with a counselor for support.  Ask people who smoke to avoid smoking around you.  Avoid places that trigger you to smoke, such as bars, parties, or smoke-break areas at work.  Spend time around people who do not smoke.  Lessen stress in your life, because stress can be a smoking trigger for some people. To lessen stress, try: ? Exercising regularly. ? Deep-breathing exercises. ? Yoga. ? Meditating. ? Performing a body scan. This involves closing your eyes, scanning your body from head to toe, and noticing which parts of your body are particularly tense. Purposefully relax the muscles in those areas.  Download or purchase mobile phone or tablet apps (applications) that can help you stick to your quit plan by providing reminders, tips, and encouragement. There are many free apps, such as QuitGuide from the CDC (Centers for Disease Control and Prevention). You can find other support for quitting smoking (smoking cessation) through smokefree.gov and other websites.  How will I feel when I quit smoking? Within the first 24 hours of quitting smoking, you may start to feel some withdrawal symptoms. These symptoms are usually most noticeable 2-3 days after quitting, but they usually do not last beyond 2-3 weeks. Changes or symptoms that you might experience include:  Mood swings.  Restlessness, anxiety, or irritation.  Difficulty concentrating.  Dizziness.  Strong cravings for sugary foods in addition to nicotine.  Mild weight gain.  Constipation.  Nausea.  Coughing or a sore throat.  Changes in how your medicines work in your  body.  A depressed mood.  Difficulty sleeping (insomnia).  After the first 2-3 weeks of quitting, you may start to notice more positive results, such as:  Improved sense of smell and taste.  Decreased coughing and sore throat.  Slower heart rate.  Lower blood pressure.  Clearer skin.  The ability to breathe more easily.  Fewer sick days.  Quitting smoking is very challenging for most people. Do not get discouraged if you are not successful the first time. Some people need to make many attempts to quit before they achieve long-term success. Do your best to stick to your quit plan, and talk with your health care provider if you have any questions or concerns. This information is not intended to replace advice given to you by your health care provider. Make sure you discuss any questions you have with your health care provider. Document Released: 12/11/2001 Document Revised: 08/14/2016 Document Reviewed: 05/03/2015 Elsevier Interactive Patient Education  2017 Elsevier Inc.  

## 2017-05-16 NOTE — Progress Notes (Signed)
   Subjective:    Patient ID: Fred Weaver, male    DOB: 04/14/1966, 51 y.o.   MRN: [id number]  Hypertension  This is a chronic problem. The current episode started more than 1 year ago. Pertinent negatives include no chest pain, headaches or shortness of breath. Risk factors for coronary artery disease include male gender. Treatments tried: lisinopril/hctz. There are no compliance problems.    Tired-Intermittent fatigue tiredness feels rundown denies any trouble with sleep apnea symptoms.  Poor sleep does not always sleep well. In eyes been under excessive stress. States it's difficult to relax at times He does smoke he does drink some soda exam energy drinks  Anger issues he denies being depressed he states at times he does get angry he relates it to some of the experiences he had when he was in [employer] he denies being depressed denies being homicidal does not feel counseling would be of any help does not want to go to a counselor we did talk about ways to try to relax his best as possible into try to avoid getting angry   Review of Systems  Constitutional: Negative for activity change, fatigue and fever.  Respiratory: Negative for cough and shortness of breath.   Cardiovascular: Negative for chest pain and leg swelling.  Neurological: Negative for headaches.       Objective:   Physical Exam  Constitutional: He appears well-nourished. No distress.  Cardiovascular: Normal rate, regular rhythm and normal heart sounds.   No murmur heard. Pulmonary/Chest: Effort normal and breath sounds normal. No respiratory distress.  Musculoskeletal: He exhibits no edema.  Lymphadenopathy:    He has no cervical adenopathy.  Neurological: He is alert.  Psychiatric: His behavior is normal.  Vitals reviewed.         Assessment & Plan:  Patient defers prostate exam Screening labs ordered Blood pressure medicine refilled follow-up in one year Intermittent anger issues denies being  suicidal homicidal but patient was counseled that he would benefit from seeing psychologist he defers on this If he should decide differently on a counselor he will let us now Mild insomnia gave him information of what to try for this from a behavioral standpoint  Colonoscopy referral patient wants: Consultation one month in the procedure the following month he has a hard time getting days off from work

## 2017-05-17 ENCOUNTER — Encounter: Payer: Self-pay | Admitting: Family Medicine

## 2017-05-17 LAB — CBC WITH DIFFERENTIAL/PLATELET
BASOS: 0 %
Basophils Absolute: 0 10*3/uL (ref 0.0–0.2)
EOS (ABSOLUTE): 0.3 10*3/uL (ref 0.0–0.4)
Eos: 6 %
Hematocrit: 47.2 % (ref 37.5–51.0)
Hemoglobin: 16.4 g/dL (ref 13.0–17.7)
IMMATURE GRANULOCYTES: 0 %
Immature Grans (Abs): 0 10*3/uL (ref 0.0–0.1)
Lymphocytes Absolute: 1.5 10*3/uL (ref 0.7–3.1)
Lymphs: 27 %
MCH: 30.4 pg (ref 26.6–33.0)
MCHC: 34.7 g/dL (ref 31.5–35.7)
MCV: 88 fL (ref 79–97)
Monocytes Absolute: 0.5 10*3/uL (ref 0.1–0.9)
Monocytes: 9 %
NEUTROS PCT: 58 %
Neutrophils Absolute: 3.2 10*3/uL (ref 1.4–7.0)
PLATELETS: 171 10*3/uL (ref 150–379)
RBC: 5.39 x10E6/uL (ref 4.14–5.80)
RDW: 13.1 % (ref 12.3–15.4)
WBC: 5.5 10*3/uL (ref 3.4–10.8)

## 2017-05-17 LAB — LIPID PANEL
CHOL/HDL RATIO: 3.6 ratio (ref 0.0–5.0)
CHOLESTEROL TOTAL: 139 mg/dL (ref 100–199)
HDL: 39 mg/dL — ABNORMAL LOW (ref >39)
LDL Calculated: 77 mg/dL (ref 0–99)
TRIGLYCERIDES: 117 mg/dL (ref 0–149)
VLDL Cholesterol Cal: 23 mg/dL (ref 5–40)

## 2017-05-17 LAB — BASIC METABOLIC PANEL
BUN/Creatinine Ratio: 12 (ref 9–20)
BUN: 15 mg/dL (ref 6–24)
CO2: 28 mmol/L (ref 18–29)
Calcium: 9.5 mg/dL (ref 8.7–10.2)
Chloride: 99 mmol/L (ref 96–106)
Creatinine, Ser: 1.24 mg/dL (ref 0.76–1.27)
GFR calc Af Amer: 77 mL/min/{1.73_m2} (ref >59)
GFR, EST NON AFRICAN AMERICAN: 66 mL/min/{1.73_m2} (ref >59)
GLUCOSE: 97 mg/dL (ref 65–99)
POTASSIUM: 4.1 mmol/L (ref 3.5–5.2)
SODIUM: 142 mmol/L (ref 134–144)

## 2017-05-17 LAB — TSH: TSH: 0.38 u[IU]/mL — ABNORMAL LOW (ref 0.450–4.500)

## 2017-05-17 LAB — PSA: PROSTATE SPECIFIC AG, SERUM: 1.8 ng/mL (ref 0.0–4.0)

## 2017-05-20 ENCOUNTER — Other Ambulatory Visit: Payer: Self-pay | Admitting: *Deleted

## 2017-05-20 DIAGNOSIS — R7989 Other specified abnormal findings of blood chemistry: Secondary | ICD-10-CM

## 2017-05-20 NOTE — Addendum Note (Signed)
Addended by: Dairl Ponder on: 05/20/2017 11:40 AM   Modules accepted: Orders

## 2017-06-12 ENCOUNTER — Encounter: Payer: Self-pay | Admitting: Gastroenterology

## 2017-06-22 LAB — T4, FREE: Free T4: 1.34 ng/dL (ref 0.82–1.77)

## 2017-06-22 LAB — T3: T3 TOTAL: 168 ng/dL (ref 71–180)

## 2017-06-22 LAB — TSH: TSH: 0.422 u[IU]/mL — ABNORMAL LOW (ref 0.450–4.500)

## 2017-06-26 ENCOUNTER — Encounter: Payer: Self-pay | Admitting: Family Medicine

## 2017-08-02 ENCOUNTER — Emergency Department (HOSPITAL_COMMUNITY)
Admission: EM | Admit: 2017-08-02 | Discharge: 2017-08-02 | Disposition: A | Discharge status: Home / Self Care | Payer: BLUE CROSS/BLUE SHIELD | Attending: Emergency Medicine | Admitting: Emergency Medicine

## 2017-08-02 ENCOUNTER — Encounter (HOSPITAL_COMMUNITY): Payer: Self-pay | Admitting: Emergency Medicine

## 2017-08-02 DIAGNOSIS — K029 Dental caries, unspecified: Secondary | ICD-10-CM | POA: Diagnosis not present

## 2017-08-02 DIAGNOSIS — H6592 Unspecified nonsuppurative otitis media, left ear: Secondary | ICD-10-CM

## 2017-08-02 DIAGNOSIS — K0889 Other specified disorders of teeth and supporting structures: Secondary | ICD-10-CM | POA: Diagnosis present

## 2017-08-02 DIAGNOSIS — Z79899 Other long term (current) drug therapy: Secondary | ICD-10-CM | POA: Insufficient documentation

## 2017-08-02 DIAGNOSIS — F17200 Nicotine dependence, unspecified, uncomplicated: Secondary | ICD-10-CM | POA: Insufficient documentation

## 2017-08-02 MED ORDER — TRAMADOL HCL 50 MG PO TABS: 50.0000 mg | ORAL_TABLET | Freq: Four times a day (QID) | ORAL | 0 refills | Status: DC | PRN

## 2017-08-02 MED ORDER — AMOXICILLIN 500 MG PO CAPS: 500.0000 mg | ORAL_CAPSULE | Freq: Three times a day (TID) | ORAL | 0 refills | Status: DC

## 2017-08-02 MED ORDER — AMOXICILLIN 500 MG PO CAPS
500.0000 mg | ORAL_CAPSULE | Freq: Once | ORAL | Status: AC
  Administered 2017-08-02: 500 mg via ORAL
  Filled 2017-08-02: qty 1

## 2017-08-02 MED ORDER — IBUPROFEN 400 MG PO TABS
400.0000 mg | ORAL_TABLET | Freq: Once | ORAL | Status: AC | PRN
  Administered 2017-08-02: 400 mg via ORAL

## 2017-08-02 MED ORDER — IBUPROFEN 400 MG PO TABS
ORAL_TABLET | ORAL | Status: AC
  Filled 2017-08-02: qty 1

## 2017-08-02 MED ORDER — IBUPROFEN 800 MG PO TABS
800.0000 mg | ORAL_TABLET | Freq: Once | ORAL | Status: AC
  Administered 2017-08-02: 800 mg via ORAL
  Filled 2017-08-02: qty 1

## 2017-08-02 NOTE — Discharge Instructions (Signed)
You may alternate Tylenol 1000 mg every 6 hours as needed for pain and Ibuprofen 800 mg every 8 hours as needed for pain.  Please take Ibuprofen with food. ° °

## 2017-08-02 NOTE — ED Triage Notes (Signed)
Pt c/o toothache on the left side along with an earache and headache. Pain began at 1500 yesterday afternoon.

## 2017-08-02 NOTE — ED Notes (Signed)
Pt presents with toothache and earache on L side.  He feels like his "eardrum busted."

## 2017-08-02 NOTE — ED Provider Notes (Signed)
By signing my name below, I, Theresia Bough, attest that this documentation has been prepared under the direction and in the presence of Dylynn Ketner, Delice Bison, DO. Electronically Signed: Theresia Bough, ED Scribe. 08/02/17. 3:56 AM.  TIME SEEN: 3:53 AM  CHIEF COMPLAINT: Otalgia and Dental Pain.  HPI: HPI Comments: Fred Weaver is a 51 y.o. male with a PMHx of HTN, who presents to the Emergency Department complaining of constant, moderate lower left dental pain onset 12 hours ago. Pt reports associated earache and headache. No treatments tried PTA. Pt denies sore throat or any other complaints at this time. NKDA.  He denies any cough. No facial swelling, erythema or warmth. Does not have a dentist.   ROS: See HPI Constitutional: no fever  Eyes: no drainage  ENT: no runny nose, +ear pain, +dental pain, no sore throat  Cardiovascular:  no chest pain  Resp: no SOB  GI: no vomiting GU: no dysuria Integumentary: no rash  Allergy: no hives  Musculoskeletal: no leg swelling  Neurological: no slurred speech, +HA ROS otherwise negative  PAST MEDICAL HISTORY/PAST SURGICAL HISTORY:  Past Medical History:  Diagnosis Date  . Hypertension     MEDICATIONS:  Prior to Admission medications   Medication Sig Start Date End Date Taking? Authorizing Provider  lisinopril-hydrochlorothiazide (ZESTORETIC) 20-12.5 MG tablet Take 1 tablet by mouth daily. 05/16/17   Kathyrn Drown, MD  Multiple Vitamin (MULTIVITAMIN WITH MINERALS) TABS Take 1 tablet by mouth daily.    [provider]    ALLERGIES:  No Known Allergies  SOCIAL HISTORY:  Social History  Substance Use Topics  . Smoking status: Current Some Day Smoker  . Smokeless tobacco: Former Systems developer    Quit date: 04/27/2013  . Alcohol use Yes     Comment: ocassionally     FAMILY HISTORY: Family History  Problem Relation Age of Onset  . Cancer Mother        breast  . Diabetes Mother   . Stroke Father   . Cancer Father         prostate  . Hypertension Father   . Hypertension Sister   . Hypertension Sister     EXAM: BP 117/80 (BP Location: Right Arm)   Pulse 63   Temp 98.4 F (36.9 C) (Oral)   Resp 20   Ht 6' (1.829 m)   Wt 205 lb (93 kg)   SpO2 96%   BMI 27.80 kg/m  CONSTITUTIONAL: Alert and oriented and responds appropriately to questions. Well-appearing; well-nourished, afebrile, does appear uncomfortable HEAD: Normocephalic EYES: Conjunctivae clear, pupils appear equal, EOMI ENT: normal nose; moist mucous membranes; No pharyngeal erythema or petechiae, no tonsillar hypertrophy or exudate, patient does have bilateral tonsilliths, no uvular deviation, no unilateral swelling, no trismus or drooling, no muffled voice, normal phonation, no stridor, multiple dental caries present; patient has pain over the left lower third molar with associated decay, no drainable dental abscess noted, no Ludwig's angina, tongue sits flat in the bottom of the mouth, no angioedema, no facial erythema or warmth, no facial swelling; no pain with movement of the neck.  Right TM is clear without erythema, purulence, bulging, perforation, effusion.  No cerumen impaction or sign of foreign body in the external auditory canal. No inflammation, erythema or drainage from the external auditory canal. No signs of mastoiditis. No pain with manipulation of the pinna bilaterally. Patient's left TM is erythematous, bulging with associated purulent effusion but no perforation. NECK: Supple, no meningismus, no nuchal rigidity, no LAD  CARD: RRR; S1 and S2 appreciated; no murmurs, no clicks, no rubs, no gallops RESP: Normal chest excursion without splinting or tachypnea; breath sounds clear and equal bilaterally; no wheezes, no rhonchi, no rales, no hypoxia or respiratory distress, speaking full sentences ABD/GI: Normal bowel sounds; non-distended; soft, non-tender, no rebound, no guarding, no peritoneal signs, no hepatosplenomegaly BACK:  The back  appears normal and is non-tender to palpation, there is no CVA tenderness EXT: Normal ROM in all joints; non-tender to palpation; no edema; normal capillary refill; no cyanosis, no calf tenderness or swelling    SKIN: Normal color for age and race; warm; no rash NEURO: Moves all extremities equally PSYCH: The patient's mood and manner are appropriate. Grooming and personal hygiene are appropriate.  MEDICAL DECISION MAKING: Patient here with dental caries causing dental pain as well as otitis media. No sign of Ludwig's angina. No sign of peritonsillar abscess, deep space neck infection, meningitis. We'll discharge on amoxicillin. Have given him outpatient dental follow-up. We'll discharge with short course of tramadol for pain control.  Recommend alternating Tylenol and Motrin for pain. Discussed return cautions. Patient is comfortable with this plan and verbalized understanding.  At this time, I do not feel there is any life-threatening condition present. I have reviewed and discussed all results (EKG, imaging, lab, urine as appropriate) and exam findings with patient/family. I have reviewed nursing notes and appropriate previous records.  I feel the patient is safe to be discharged home without further emergent workup and can continue workup as an outpatient as needed. Discussed usual and customary return precautions. Patient/family verbalize understanding and are comfortable with this plan.  Outpatient follow-up has been provided if needed. All questions have been answered.  I personally performed the services described in this documentation, which was scribed in my presence. The recorded information has been reviewed and is accurate.     Alaia Lordi, Delice Bison, DO 08/02/17 [phone]

## 2017-08-09 ENCOUNTER — Encounter: Payer: Self-pay | Admitting: Family Medicine

## 2017-08-09 ENCOUNTER — Ambulatory Visit (INDEPENDENT_AMBULATORY_CARE_PROVIDER_SITE_OTHER): Payer: BLUE CROSS/BLUE SHIELD | Admitting: Family Medicine

## 2017-08-09 ENCOUNTER — Ambulatory Visit (AMBULATORY_SURGERY_CENTER): Payer: Self-pay | Admitting: *Deleted

## 2017-08-09 VITALS — BP 118/82 | Temp 98.9°F | Ht 72.0 in | Wt 196.1 lb

## 2017-08-09 VITALS — Ht 72.0 in | Wt 196.0 lb

## 2017-08-09 DIAGNOSIS — H6502 Acute serous otitis media, left ear: Secondary | ICD-10-CM

## 2017-08-09 DIAGNOSIS — Z1211 Encounter for screening for malignant neoplasm of colon: Secondary | ICD-10-CM

## 2017-08-09 MED ORDER — CEFDINIR 300 MG PO CAPS: 300.0000 mg | ORAL_CAPSULE | Freq: Two times a day (BID) | ORAL | 0 refills | Status: DC

## 2017-08-09 MED ORDER — TRAMADOL HCL 50 MG PO TABS: 50.0000 mg | ORAL_TABLET | Freq: Four times a day (QID) | ORAL | 0 refills | Status: DC | PRN

## 2017-08-09 MED ORDER — NA SULFATE-K SULFATE-MG SULF 17.5-3.13-1.6 GM/177ML PO SOLN: ORAL | 0 refills | Status: DC

## 2017-08-09 NOTE — Progress Notes (Signed)
Patient denies any allergies to eggs or soy. Patient denies any problems with anesthesia/sedation. Patient denies any oxygen use at home and does not take any diet/weight loss medications. EMMI education assisgned to patient on colonoscopy, this was explained and instructions given to patient. Patient aware to call us back if any medical hx or medications changes, he is to see Dr. Next Friday for thyroid issues.

## 2017-08-09 NOTE — Progress Notes (Signed)
   Subjective:    Patient ID: Fred Weaver, male    DOB: 1966/01/15, 51 y.o.   MRN: [id number]  HPI  Patient is here today to follow up on left ear. States went to the hospital for a toothache,but turns out it was an ear infx. States there is no longer any pain,but it is stopped up,can barely hear out of it. No other concerns.   Patient notes congestion drainage stuffiness in the week prior to symptoms starting. Next  Due to see dentist soon.  Was given a prescription for amoxicillin. Notes ongoing pain in the year. Ongoing muffled sensation as far as hearing Review of Systems No headache, no major weight loss or weight gain, no chest pain no back pain abdominal pain no change in bowel habits complete ROS otherwise negative     Objective:   Physical Exam  Alert vitals stable, NAD. Blood pressure good on repeat. HEENTLeft otitis media evident, dentition somewhat poor posterior jaw, otherwise normal. Lungs clear. Heart regular rate and rhythm.       Assessment & Plan:  Impression persistent otitis media #2 poor dentition plan patient to follow-up with Simona Huh. Stop amoxicillin start [place]. Local measures discussed. Counseled may take several weeks for middle ear fluid to dissipate

## 2017-08-12 ENCOUNTER — Encounter: Payer: Self-pay | Admitting: Gastroenterology

## 2017-08-16 ENCOUNTER — Encounter: Payer: Self-pay | Admitting: Endocrinology

## 2017-08-16 ENCOUNTER — Ambulatory Visit (INDEPENDENT_AMBULATORY_CARE_PROVIDER_SITE_OTHER): Payer: BLUE CROSS/BLUE SHIELD | Admitting: Endocrinology

## 2017-08-16 DIAGNOSIS — E0590 Thyrotoxicosis, unspecified without thyrotoxic crisis or storm: Secondary | ICD-10-CM

## 2017-08-16 MED ORDER — METHIMAZOLE 5 MG PO TABS: 5.0000 mg | ORAL_TABLET | Freq: Three times a day (TID) | ORAL | 5 refills | Status: DC
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
## 2017-08-16 NOTE — Patient Instructions (Signed)
I have sent a prescription to your pharmacy, to slow the thyroid. If ever you have fever while taking methimazole, stop it and call us, even if the reason is obvious, because of the risk of a rare side-effect. Please come back for a follow-up appointment in 1-2 months.

## 2017-08-18 DIAGNOSIS — E0590 Thyrotoxicosis, unspecified without thyrotoxic crisis or storm: Secondary | ICD-10-CM | POA: Insufficient documentation

## 2017-08-23 ENCOUNTER — Ambulatory Visit (AMBULATORY_SURGERY_CENTER): Payer: BLUE CROSS/BLUE SHIELD | Admitting: Gastroenterology

## 2017-08-23 ENCOUNTER — Encounter: Payer: Self-pay | Admitting: Gastroenterology

## 2017-08-23 VITALS — BP 112/63 | HR 64 | Temp 98.4°F | Resp 15 | Ht 72.0 in | Wt 196.0 lb

## 2017-08-23 DIAGNOSIS — D120 Benign neoplasm of cecum: Secondary | ICD-10-CM | POA: Diagnosis not present

## 2017-08-23 DIAGNOSIS — Z1211 Encounter for screening for malignant neoplasm of colon: Secondary | ICD-10-CM | POA: Diagnosis present

## 2017-08-23 DIAGNOSIS — Z1212 Encounter for screening for malignant neoplasm of rectum: Secondary | ICD-10-CM | POA: Diagnosis not present

## 2017-08-23 MED ORDER — SODIUM CHLORIDE 0.9 % IV SOLN: 500.0000 mL | INTRAVENOUS | Status: DC

## 2017-08-23 NOTE — Progress Notes (Signed)
IV restarted in admitting per Andreas Ohm RN.  Wife at bedside.  Monitor placed on pt and Versed 2 mg IV given per Andreas Ohm RN.  Pt resting with eyes closed at this time.  VSS

## 2017-08-23 NOTE — Progress Notes (Signed)
Pt denies pain at this time, just states, "I am extremely irritated because I am so hungry."  Pt has recently had an ear infection and cannot hear out of her left ear.  He has finished his antibiotic.

## 2017-08-23 NOTE — Progress Notes (Signed)
Upon arrival to the endoscopy room, I had not yet met the patient and he ripped the IV out of his arm, took off all the monitors and his gown, he was very irritable / anxious and stated he wanted to leave and asked for his wife. We brought his wife into the room, explained the procedure to him, why he was here, and they discussed whether or not he wanted to proceed. He seemed quite anxious about having anesthesia - I offered him an unsedated exam but following discussion of this he wanted to proceed with anesthesia. Wife will be present until the patient is sedated. Discussed risks / benefits and he wanted to proceed.

## 2017-08-23 NOTE — Patient Instructions (Signed)
YOU HAD AN ENDOSCOPIC PROCEDURE TODAY AT [facility]:   Refer to the procedure report that was given to you for any specific questions about what was found during the examination.  If the procedure report does not answer your questions, please call your gastroenterologist to clarify.  If you requested that your care partner not be given the details of your procedure findings, then the procedure report has been included in a sealed envelope for you to review at your convenience later.  YOU SHOULD EXPECT: Some feelings of bloating in the abdomen. Passage of more gas than usual.  Walking can help get rid of the air that was put into your GI tract during the procedure and reduce the bloating. If you had a lower endoscopy (such as a colonoscopy or flexible sigmoidoscopy) you may notice spotting of blood in your stool or on the toilet paper. If you underwent a bowel prep for your procedure, you may not have a normal bowel movement for a few days.  Please Note:  You might notice some irritation and congestion in your nose or some drainage.  This is from the oxygen used during your procedure.  There is no need for concern and it should clear up in a day or so.  SYMPTOMS TO REPORT IMMEDIATELY:   Following lower endoscopy (colonoscopy or flexible sigmoidoscopy):  Excessive amounts of blood in the stool  Significant tenderness or worsening of abdominal pains  Swelling of the abdomen that is new, acute  Fever of 100F or higher  For urgent or emergent issues, a gastroenterologist can be reached at any hour by calling [phone].  DIET:  We do recommend a small meal at first, but then you may proceed to your regular diet.  Drink plenty of fluids but you should avoid alcoholic beverages for 24 hours.  ACTIVITY:  You should plan to take it easy for the rest of today and you should NOT DRIVE or use heavy machinery until tomorrow (because of the sedation medicines used during the test).     FOLLOW UP: Our staff will call the number listed on your records the next business day following your procedure to check on you and address any questions or concerns that you may have regarding the information given to you following your procedure. If we do not reach you, we will leave a message.  However, if you are feeling well and you are not experiencing any problems, there is no need to return our call.  We will assume that you have returned to your regular daily activities without incident.  If any biopsies were taken you will be contacted by phone or by letter within the next 1-3 weeks.  Please call us at [phone] if you have not heard about the biopsies in 3 weeks.   SIGNATURES/CONFIDENTIALITY: You and/or your care partner have signed paperwork which will be entered into your electronic medical record.  These signatures attest to the fact that that the information above on your After Visit Summary has been reviewed and is understood.  Full responsibility of the confidentiality of this discharge information lies with you and/or your care-partner.  Await pathology  Please read over handouts about polyps and diverticulosis  Continue your normal medications

## 2017-08-23 NOTE — Progress Notes (Signed)
Called to room to assist during endoscopic procedure.  Patient ID and intended procedure confirmed with present staff. Received instructions for my participation in the procedure from the performing physician.  

## 2017-08-23 NOTE — Progress Notes (Signed)
To PACU, VSS. Report to [email or web address] 

## 2017-08-23 NOTE — Op Note (Signed)
[facility] Patient Name: Fred Weaver Procedure Date: 08/23/2017 12:14 PM MRN: [id number] Endoscopist: Remo Lipps P. Armbruster MD, MD Age: 51 Referring MD:  Date of Birth: [birth year] Gender: Male Account #: [id number] Procedure:                Colonoscopy Indications:              Screening for colorectal malignant neoplasm, This                            is the patient's first colonoscopy Medicines:                Monitored Anesthesia Care Procedure:                Pre-Anesthesia Assessment:                           - Prior to the procedure, a History and Physical                            was performed, and patient medications and                            allergies were reviewed. The patient's tolerance of                            previous anesthesia was also reviewed. The risks                            and benefits of the procedure and the sedation                            options and risks were discussed with the patient.                            All questions were answered, and informed consent                            was obtained. Prior Anticoagulants: The patient has                            taken no previous anticoagulant or antiplatelet                            agents. ASA Grade Assessment: II - A patient with                            mild systemic disease. After reviewing the risks                            and benefits, the patient was deemed in                            satisfactory condition to undergo the procedure.  After obtaining informed consent, the colonoscope                            was passed under direct vision. Throughout the                            procedure, the patient's blood pressure, pulse, and                            oxygen saturations were monitored continuously. The                            Colonoscope was introduced through the anus and                            advanced to the  the cecum, identified by                            appendiceal orifice and ileocecal valve. The                            colonoscopy was performed without difficulty. The                            patient tolerated the procedure well. The quality                            of the bowel preparation was good. The ileocecal                            valve, appendiceal orifice, and rectum were                            photographed. Scope In: 12:17:47 PM Scope Out: 12:35:26 PM Scope Withdrawal Time: 0 hours 15 minutes 35 seconds  Total Procedure Duration: 0 hours 17 minutes 39 seconds  Findings:                 The perianal and digital rectal examinations were                            normal.                           A 3 mm polyp was found in the cecum. The polyp was                            sessile. The polyp was removed with a cold snare.                            Resection and retrieval were complete.                           A few medium-mouthed diverticula were found in the  ascending colon.                           The exam was otherwise without abnormality on                            direct and retroflexion views. Complications:            No immediate complications. Estimated blood loss:                            Minimal. Estimated Blood Loss:     Estimated blood loss was minimal. Impression:               - One 3 mm polyp in the cecum, removed with a cold                            snare. Resected and retrieved.                           - Diverticulosis in the ascending colon.                           - The examination was otherwise normal on direct                            and retroflexion views. Recommendation:           - Patient has a contact number available for                            emergencies. The signs and symptoms of potential                            delayed complications were discussed with the                             patient. Return to normal activities tomorrow.                            Written discharge instructions were provided to the                            patient.                           - Resume previous diet.                           - Continue present medications.                           - Await pathology results.                           - Repeat colonoscopy is recommended for  surveillance. The colonoscopy date will be                            determined after pathology results from today's                            exam become available for review. Remo Lipps P. Armbruster MD, MD 08/23/2017 12:39:23 PM This report has been signed electronically.

## 2017-08-26 ENCOUNTER — Telehealth: Payer: Self-pay | Admitting: *Deleted

## 2017-08-26 NOTE — Telephone Encounter (Signed)
  Follow up Call-  Call back number 08/23/2017  Post procedure Call Back phone  # [phone]  Permission to leave phone message Yes  Some recent data might be hidden     Patient questions:  Do you have a fever, pain , or abdominal swelling? No. Pain Score  0 *  Have you tolerated food without any problems? Yes.    Have you been able to return to your normal activities? Yes.    Do you have any questions about your discharge instructions: Diet   No. Medications  No. Follow up visit  No.  Do you have questions or concerns about your Care? No.  Actions: * If pain score is 4 or above: No action needed, pain <4.

## 2017-08-28 ENCOUNTER — Encounter: Payer: Self-pay | Admitting: Gastroenterology

## 2017-10-18 ENCOUNTER — Encounter: Payer: Self-pay | Admitting: Endocrinology

## 2017-10-18 ENCOUNTER — Ambulatory Visit (INDEPENDENT_AMBULATORY_CARE_PROVIDER_SITE_OTHER): Payer: BLUE CROSS/BLUE SHIELD | Admitting: Endocrinology

## 2017-10-18 VITALS — BP 140/90 | HR 58 | Wt 203.2 lb

## 2017-10-18 DIAGNOSIS — E0590 Thyrotoxicosis, unspecified without thyrotoxic crisis or storm: Secondary | ICD-10-CM

## 2017-10-18 LAB — T4, FREE: Free T4: 0.99 ng/dL (ref 0.60–1.60)

## 2017-10-18 LAB — TSH: TSH: 0.93 u[IU]/mL (ref 0.35–4.50)

## 2017-10-18 NOTE — Patient Instructions (Addendum)
blood tests are requested for you today.  We'll let you know about the results.   If it is overactive, and if you want to, I can prescribe for you a different type of thyroid medication.   If ever you have fever while taking methimazole, stop it and call us, even if the reason is obvious, because of the risk of a rare side-effect.  Please come back for a follow-up appointment in 3 months.
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
## 2018-01-20 ENCOUNTER — Ambulatory Visit: Payer: BLUE CROSS/BLUE SHIELD | Admitting: Endocrinology

## 2018-02-11 ENCOUNTER — Encounter: Payer: Self-pay | Admitting: Endocrinology

## 2018-02-11 ENCOUNTER — Ambulatory Visit (INDEPENDENT_AMBULATORY_CARE_PROVIDER_SITE_OTHER): Payer: BLUE CROSS/BLUE SHIELD | Admitting: Endocrinology

## 2018-02-11 VITALS — BP 118/74 | HR 68 | Wt 204.8 lb

## 2018-02-11 DIAGNOSIS — E0590 Thyrotoxicosis, unspecified without thyrotoxic crisis or storm: Secondary | ICD-10-CM

## 2018-02-11 LAB — TSH: TSH: 0.54 u[IU]/mL (ref 0.35–4.50)

## 2018-02-11 LAB — T4, FREE: Free T4: 1.04 ng/dL (ref 0.60–1.60)
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
## 2018-02-11 NOTE — Patient Instructions (Addendum)
blood tests are requested for you today.  We'll let you know about the results.   Please come back for a follow-up appointment in 6 months.  

## 2018-04-11 ENCOUNTER — Telehealth: Payer: Self-pay | Admitting: Family Medicine

## 2018-04-11 MED ORDER — LISINOPRIL-HYDROCHLOROTHIAZIDE 20-12.5 MG PO TABS: 1.0000 | ORAL_TABLET | Freq: Every day | ORAL | 0 refills | Status: DC

## 2018-04-11 NOTE — Telephone Encounter (Signed)
Patient is requesting a refill on his lisinopril-hydrochlorothiazide.  He said he only has 2 pills left.  He has a follow up appointment on 04/17/18.  [facility] [address]. [place], [place]

## 2018-04-17 ENCOUNTER — Ambulatory Visit (INDEPENDENT_AMBULATORY_CARE_PROVIDER_SITE_OTHER): Payer: BLUE CROSS/BLUE SHIELD | Admitting: Family Medicine

## 2018-04-17 ENCOUNTER — Encounter: Payer: Self-pay | Admitting: Family Medicine

## 2018-04-17 VITALS — BP 122/88 | Ht 72.0 in | Wt 202.6 lb

## 2018-04-17 DIAGNOSIS — Z13220 Encounter for screening for lipoid disorders: Secondary | ICD-10-CM

## 2018-04-17 DIAGNOSIS — Z125 Encounter for screening for malignant neoplasm of prostate: Secondary | ICD-10-CM

## 2018-04-17 DIAGNOSIS — I10 Essential (primary) hypertension: Secondary | ICD-10-CM | POA: Diagnosis not present

## 2018-04-17 MED ORDER — LISINOPRIL-HYDROCHLOROTHIAZIDE 20-12.5 MG PO TABS: 1.0000 | ORAL_TABLET | Freq: Every day | ORAL | 3 refills | Status: DC

## 2018-04-17 NOTE — Progress Notes (Signed)
   Subjective:    Patient ID: Fred Weaver, male    DOB: 1966/08/12, 52 y.o.   MRN: [id number]  HPI Pt here today for med check. Pt needs refills on meds.  Very nice gentleman here for follow-up He does try to eat relatively healthy He does do some exercising He still struggles with some emotional issues for the time he served in the [employer] He does not want to see a counselor He does not feel that would help him Patient not suicidal or homicidal Patient defers on prostate exam but is willing to do lab work Did do his colonoscopy Next one will be due in 2023 Review of Systems  Constitutional: Negative for activity change, fatigue and fever.  HENT: Negative for congestion and rhinorrhea.   Respiratory: Negative for cough and shortness of breath.   Cardiovascular: Negative for chest pain and leg swelling.  Gastrointestinal: Negative for abdominal pain, diarrhea and nausea.  Genitourinary: Negative for dysuria and hematuria.  Neurological: Negative for weakness and headaches.  Psychiatric/Behavioral: Negative for behavioral problems.       Objective:   Physical Exam  Constitutional: He appears well-nourished. No distress.  HENT:  Head: Normocephalic and atraumatic.  Eyes: Right eye exhibits no discharge. Left eye exhibits no discharge.  Neck: No tracheal deviation present.  Cardiovascular: Normal rate, regular rhythm and normal heart sounds.  No murmur heard. Pulmonary/Chest: Effort normal and breath sounds normal. No respiratory distress. He has no wheezes.  Musculoskeletal: He exhibits no edema.  Lymphadenopathy:    He has no cervical adenopathy.  Neurological: He is alert.  Skin: Skin is warm. No rash noted.  Psychiatric: His behavior is normal.  Vitals reviewed.         Assessment & Plan:  HTN- Patient was seen today as part of a visit regarding hypertension. The importance of healthy diet and regular physical activity was discussed. The importance of  compliance with medications discussed.  Ideal goal is to keep blood pressure low elevated levels certainly below 510/25 when possible.  The patient was counseled that keeping blood pressure under control lessen his risk of heart attack, stroke, kidney failure, and early death.  The importance of regular follow-ups was discussed with the patient.  Low-salt diet such as DASH recommended. Regular physical activity was recommended as well.  Patient was advised to keep regular follow-ups.  Patient will do his lab work  Patient will follow-up in 1 year follow-up sooner if any problems  Await lab work results

## 2018-08-11 ENCOUNTER — Ambulatory Visit: Payer: BLUE CROSS/BLUE SHIELD | Admitting: Endocrinology

## 2018-08-11 DIAGNOSIS — Z0289 Encounter for other administrative examinations: Secondary | ICD-10-CM

## 2019-03-23 ENCOUNTER — Telehealth: Payer: Self-pay | Admitting: Family Medicine

## 2019-03-23 NOTE — Telephone Encounter (Signed)
Pt called to schedule his yearly OV med check  Explained that due to the Covid risk we may call in refills or do a televisit  Please advise & call pt

## 2019-03-23 NOTE — Telephone Encounter (Signed)
Pt states he does need refill on Lisinopril. Pt states he is not having any problems at this time. Please advise. Thank you

## 2019-03-23 NOTE — Telephone Encounter (Signed)
I would recommend we give him 3 months refill and in 3 months we should but have a much better feel for how things are going.  At that time.

## 2019-03-24 ENCOUNTER — Other Ambulatory Visit: Payer: Self-pay | Admitting: *Deleted

## 2019-03-24 MED ORDER — LISINOPRIL-HYDROCHLOROTHIAZIDE 20-12.5 MG PO TABS: 1.0000 | ORAL_TABLET | Freq: Every day | ORAL | 1 refills | Status: DC

## 2019-03-24 NOTE — Telephone Encounter (Signed)
May have this +3 additional months I recommend the patient schedule a follow-up office visit somewhere in the coming months once this starts to settle down more than likely 6 to 8 weeks

## 2019-03-24 NOTE — Telephone Encounter (Signed)
Refills sent in and patient is aware. Pt transferred up front to set up office visit. Pt verbalized understanding.

## 2019-05-12 ENCOUNTER — Other Ambulatory Visit: Payer: Self-pay

## 2019-05-12 ENCOUNTER — Ambulatory Visit (INDEPENDENT_AMBULATORY_CARE_PROVIDER_SITE_OTHER): Payer: BLUE CROSS/BLUE SHIELD | Admitting: Family Medicine

## 2019-05-12 DIAGNOSIS — I10 Essential (primary) hypertension: Secondary | ICD-10-CM | POA: Diagnosis not present

## 2019-05-12 MED ORDER — LISINOPRIL-HYDROCHLOROTHIAZIDE 20-25 MG PO TABS: 1.0000 | ORAL_TABLET | Freq: Every day | ORAL | 1 refills | Status: DC

## 2019-05-12 NOTE — Progress Notes (Signed)
   Subjective:    Patient ID: Fred Weaver, male    DOB: [birth year], 53 y.o.   MRN: [id number]  Hypertension  This is a chronic problem. Pertinent negatives include no chest pain, headaches or shortness of breath. Treatments tried: lisinopril- hctz 20/12.5mg . There are no compliance problems (eats as healthy as he can, exercises, takes meds every day).   Patient states he still smokes he states he is trying to cut back down for he is going to quit Under a fair amount of stress but states he tries to handle it well stays physically active.  Denies being depressed Virtual Visit via Video Note  I connected with Fred Weaver on 05/12/19 at  9:30 AM EDT by a video enabled telemedicine application and verified that I am speaking with the correct person using two identifiers.  Location: Patient: home Provider: office   I discussed the limitations of evaluation and management by telemedicine and the availability of in person appointments. The patient expressed understanding and agreed to proceed.  History of Present Illness:    Observations/Objective:   Assessment and Plan:   Follow Up Instructions:    I discussed the assessment and treatment plan with the patient. The patient was provided an opportunity to ask questions and all were answered. The patient agreed with the plan and demonstrated an understanding of the instructions.   The patient was advised to call back or seek an in-person evaluation if the symptoms worsen or if the condition fails to improve as anticipated.  I provided 15 minutes of non-face-to-face time during this encounter.      Review of Systems  Constitutional: Negative for activity change, fatigue and fever.  HENT: Negative for congestion and rhinorrhea.   Respiratory: Negative for cough and shortness of breath.   Cardiovascular: Negative for chest pain and leg swelling.  Gastrointestinal: Negative for abdominal pain, diarrhea and nausea.   Genitourinary: Negative for dysuria and hematuria.  Neurological: Negative for weakness and headaches.  Psychiatric/Behavioral: Negative for agitation and behavioral problems.       Objective:   Physical Exam Unable to do physical exam but patient does not appear to be in any distress he is able to converse freely no shortness of breath detected       Assessment & Plan:  HTN- Patient was seen today as part of a visit regarding hypertension. The importance of healthy diet and regular physical activity was discussed. The importance of compliance with medications discussed.  Ideal goal is to keep blood pressure low elevated levels certainly below 448/18 when possible.  The patient was counseled that keeping blood pressure under control lessen his risk of complications.  The importance of regular follow-ups was discussed with the patient.  Low-salt diet such as DASH recommended.  Regular physical activity was recommended as well.  Patient was advised to keep regular follow-ups.  15 minutes was spent with patient today discussing healthcare issues which they came.  More than 50% of this visit-total duration of visit-was spent in counseling and coordination of care.  Please see diagnosis regarding the focus of this coordination and care  Blood pressure readings he has been getting from his work nurse are elevated in the 150 over 80s therefore increase the dose of his medicine recommend lisinopril 20/25 mg HCTZ follow-up within 3 to 4 months lab work later this year

## 2019-07-13 ENCOUNTER — Encounter: Payer: Self-pay | Admitting: Family Medicine

## 2019-07-13 ENCOUNTER — Other Ambulatory Visit: Payer: Self-pay

## 2019-07-13 ENCOUNTER — Ambulatory Visit (INDEPENDENT_AMBULATORY_CARE_PROVIDER_SITE_OTHER): Payer: BC Managed Care – PPO | Admitting: Family Medicine

## 2019-07-13 DIAGNOSIS — H9312 Tinnitus, left ear: Secondary | ICD-10-CM | POA: Diagnosis not present

## 2019-07-13 MED ORDER — CEFDINIR 300 MG PO CAPS: ORAL_CAPSULE | ORAL | 0 refills | Status: DC

## 2019-07-13 NOTE — Progress Notes (Signed)
   Subjective:  Audio  Patient ID: Fred Weaver, male    DOB: [birth year], 53 y.o.   MRN: [id number]  HPI Pt states he has been having a constant ringing in left ear. Pt states he doesn't know exactly when it started but it has become annoying. Pt states this is now affecting his sleep. Pt has not tried at treatment. Lisinopril has been increased to 20-25 at last virtual visit on May 12 2019.   Virtual Visit via Video Note  I connected with Fred Weaver on 07/13/19 at  3:50 PM EDT by a video enabled telemedicine application and verified that I am speaking with the correct person using two identifiers.  Location: Patient: home Provider: office    I discussed the limitations of evaluation and management by telemedicine and the availability of in person appointments. The patient expressed understanding and agreed to proceed.  History of Present Illness:    Observations/Objective:   Assessment and Plan:   Follow Up Instructions:    I discussed the assessment and treatment plan with the patient. The patient was provided an opportunity to ask questions and all were answered. The patient agreed with the plan and demonstrated an understanding of the instructions.   The patient was advised to call back or seek an in-person evaluation if the symptoms worsen or if the condition fails to improve as anticipated.  I provided 43minutes of non-face-to-face time during this encounter.   Vicente Males, LPN  Review of Systems No headache, no major weight loss or weight gain, no chest pain no back pain abdominal pain no change in bowel habits complete ROS otherwise negative     Objective:   Physical Exam   Virtual.     Assessment & Plan:  Impression unilateral tinnitus  Discussed at great length.  Will give trial of antibiotics.  If this does not improve call back and we will consider backing off strength of current blood pressure medicine to prior hydrochlorothiazide component  dose.  May need ENT referral at one point

## 2019-07-29 ENCOUNTER — Encounter: Payer: Self-pay | Admitting: Family Medicine

## 2019-07-29 DIAGNOSIS — H9312 Tinnitus, left ear: Secondary | ICD-10-CM

## 2019-07-30 NOTE — Addendum Note (Signed)
Addended by: Vicente Males on: 07/30/2019 11:54 AM   Modules accepted: Orders

## 2019-07-30 NOTE — Telephone Encounter (Signed)
Called pt and he is ready to see ENT. Does not have a preference. Seen on 07/13/19 for this issue and note states he may need referral to ENT.

## 2019-07-30 NOTE — Telephone Encounter (Signed)
Please go ahead and set up the patient with Dr. Noreene Filbert office Please let patient know what to expect regarding referral process

## 2019-08-03 ENCOUNTER — Encounter: Payer: Self-pay | Admitting: Family Medicine

## 2019-08-03 DIAGNOSIS — H5710 Ocular pain, unspecified eye: Secondary | ICD-10-CM

## 2019-08-03 NOTE — Addendum Note (Signed)
Addended by: Dairl Ponder on: 08/03/2019 01:20 PM   Modules accepted: Orders

## 2019-08-03 NOTE — Telephone Encounter (Signed)
Nurses please communicate with patient I would recommend saline eyedrops when necessary May use allergy eyedrops as needed to help with irritation Also recommend consultation office visit with optometry/ophthalmology Please go ahead with urgent referral Ideally I would like for him to be seen on Tuesday if patient willing

## 2019-08-08 ENCOUNTER — Encounter: Payer: Self-pay | Admitting: Family Medicine

## 2019-08-10 ENCOUNTER — Other Ambulatory Visit: Payer: Self-pay

## 2019-08-10 MED ORDER — LISINOPRIL-HYDROCHLOROTHIAZIDE 20-25 MG PO TABS: 1.0000 | ORAL_TABLET | Freq: Every day | ORAL | 1 refills | Status: DC

## 2019-08-10 NOTE — Telephone Encounter (Signed)
Nurses-the best I can tell this patient actually had a prescription sent in earlier in the spring with a refill but go ahead and send it in again and let him know that it was sent again thank you  Certainly if there are other concerns by the patient please let us know I would recommend a follow-up visit in the fall to check blood pressure as a follow-up

## 2020-04-11 ENCOUNTER — Other Ambulatory Visit: Payer: Self-pay | Admitting: Family Medicine

## 2020-04-11 NOTE — Telephone Encounter (Signed)
May have 90-day, needs follow-up office visit

## 2020-04-11 NOTE — Telephone Encounter (Signed)
Scheduled 6/30

## 2020-06-29 ENCOUNTER — Encounter: Payer: Self-pay | Admitting: Family Medicine

## 2020-06-29 ENCOUNTER — Other Ambulatory Visit: Payer: Self-pay

## 2020-06-29 ENCOUNTER — Ambulatory Visit (INDEPENDENT_AMBULATORY_CARE_PROVIDER_SITE_OTHER): Payer: BC Managed Care – PPO | Admitting: Family Medicine

## 2020-06-29 VITALS — BP 114/72 | Temp 97.0°F | Ht 72.0 in | Wt 206.0 lb

## 2020-06-29 DIAGNOSIS — Z79899 Other long term (current) drug therapy: Secondary | ICD-10-CM

## 2020-06-29 DIAGNOSIS — I10 Essential (primary) hypertension: Secondary | ICD-10-CM

## 2020-06-29 DIAGNOSIS — Z13220 Encounter for screening for lipoid disorders: Secondary | ICD-10-CM

## 2020-06-29 DIAGNOSIS — Z125 Encounter for screening for malignant neoplasm of prostate: Secondary | ICD-10-CM | POA: Diagnosis not present

## 2020-06-29 MED ORDER — LISINOPRIL-HYDROCHLOROTHIAZIDE 20-12.5 MG PO TABS: 1.0000 | ORAL_TABLET | Freq: Every day | ORAL | 1 refills | Status: DC

## 2020-06-29 NOTE — Progress Notes (Signed)
   Subjective:    Patient ID: Fred Weaver, male    DOB: 12-21-1966, 54 y.o.   MRN: [id number]  Hypertension This is a chronic problem. Pertinent negatives include no chest pain, headaches or shortness of breath. Treatments tried: lisinopril-hctz. Compliance problems include exercise (takes med every day, could do more exercise).    Patient going through a rough time he lost his son 2 years ago and also lost his sister a year ago patient dealing with grief working through it does not want counseling he denies being depressed   Review of Systems  Constitutional: Negative for diaphoresis and fatigue.  HENT: Negative for congestion and rhinorrhea.   Respiratory: Negative for cough and shortness of breath.   Cardiovascular: Negative for chest pain and leg swelling.  Gastrointestinal: Negative for abdominal pain and diarrhea.  Skin: Negative for color change and rash.  Neurological: Negative for dizziness and headaches.  Psychiatric/Behavioral: Negative for behavioral problems and confusion.       Objective:   Physical Exam Vitals reviewed.  Constitutional:      General: He is not in acute distress. HENT:     Head: Normocephalic and atraumatic.  Eyes:     General:        Right eye: No discharge.        Left eye: No discharge.  Neck:     Trachea: No tracheal deviation.  Cardiovascular:     Rate and Rhythm: Normal rate and regular rhythm.     Heart sounds: Normal heart sounds. No murmur heard.   Pulmonary:     Effort: Pulmonary effort is normal. No respiratory distress.     Breath sounds: Normal breath sounds.  Lymphadenopathy:     Cervical: No cervical adenopathy.  Skin:    General: Skin is warm and dry.  Neurological:     Mental Status: He is alert.     Coordination: Coordination normal.  Psychiatric:        Behavior: Behavior normal.           Assessment & Plan:  Patient to do a wellness sometime this year Labs ordered Patient will schedule wellness for  this summer or early fall HTN- patient seen for follow-up regarding HTN.  Diet, medication compliance, appropriate labs and refills were completed.  Importance of keeping blood pressure under good control to lessen the risk of complications discussed Blood pressure good control continue current measures  Grief-patient defers on counseling

## 2020-06-30 ENCOUNTER — Encounter: Payer: Self-pay | Admitting: Family Medicine

## 2020-06-30 LAB — LIPID PANEL
Chol/HDL Ratio: 4 ratio (ref 0.0–5.0)
Cholesterol, Total: 145 mg/dL (ref 100–199)
HDL: 36 mg/dL — ABNORMAL LOW (ref >39)
LDL Chol Calc (NIH): 78 mg/dL (ref 0–99)
Triglycerides: 183 mg/dL — ABNORMAL HIGH (ref 0–149)
VLDL Cholesterol Cal: 31 mg/dL (ref 5–40)

## 2020-06-30 LAB — HEPATIC FUNCTION PANEL
ALT: 22 IU/L (ref 0–44)
AST: 25 IU/L (ref 0–40)
Albumin: 4.6 g/dL (ref 3.8–4.9)
Alkaline Phosphatase: 103 IU/L (ref 48–121)
Bilirubin Total: 0.5 mg/dL (ref 0.0–1.2)
Bilirubin, Direct: 0.16 mg/dL (ref 0.00–0.40)
Total Protein: 7.2 g/dL (ref 6.0–8.5)

## 2020-06-30 LAB — BASIC METABOLIC PANEL
BUN/Creatinine Ratio: 12 (ref 9–20)
BUN: 13 mg/dL (ref 6–24)
CO2: 26 mmol/L (ref 20–29)
Calcium: 9.4 mg/dL (ref 8.7–10.2)
Chloride: 102 mmol/L (ref 96–106)
Creatinine, Ser: 1.09 mg/dL (ref 0.76–1.27)
GFR calc Af Amer: 88 mL/min/{1.73_m2} (ref >59)
GFR calc non Af Amer: 76 mL/min/{1.73_m2} (ref >59)
Glucose: 86 mg/dL (ref 65–99)
Potassium: 3.7 mmol/L (ref 3.5–5.2)
Sodium: 142 mmol/L (ref 134–144)

## 2020-06-30 LAB — PSA: Prostate Specific Ag, Serum: 2.4 ng/mL (ref 0.0–4.0)

## 2020-07-03 ENCOUNTER — Other Ambulatory Visit: Payer: Self-pay | Admitting: Family Medicine

## 2020-12-12 ENCOUNTER — Encounter: Payer: BC Managed Care – PPO | Admitting: Family Medicine

## 2022-10-01 ENCOUNTER — Encounter: Payer: Self-pay | Admitting: Gastroenterology

## 2022-10-04 DIAGNOSIS — E789 Disorder of lipoprotein metabolism, unspecified: Secondary | ICD-10-CM | POA: Diagnosis not present

## 2022-10-04 DIAGNOSIS — I10 Essential (primary) hypertension: Secondary | ICD-10-CM | POA: Diagnosis not present

## 2022-10-04 DIAGNOSIS — R7309 Other abnormal glucose: Secondary | ICD-10-CM | POA: Diagnosis not present

## 2022-10-04 DIAGNOSIS — F17210 Nicotine dependence, cigarettes, uncomplicated: Secondary | ICD-10-CM | POA: Diagnosis not present

## 2022-10-04 DIAGNOSIS — R030 Elevated blood-pressure reading, without diagnosis of hypertension: Secondary | ICD-10-CM | POA: Diagnosis not present

## 2022-10-04 DIAGNOSIS — Z1211 Encounter for screening for malignant neoplasm of colon: Secondary | ICD-10-CM | POA: Diagnosis not present

## 2022-10-04 DIAGNOSIS — Z6827 Body mass index (BMI) 27.0-27.9, adult: Secondary | ICD-10-CM | POA: Diagnosis not present

## 2022-10-04 DIAGNOSIS — E559 Vitamin D deficiency, unspecified: Secondary | ICD-10-CM | POA: Diagnosis not present

## 2022-10-12 DIAGNOSIS — F17210 Nicotine dependence, cigarettes, uncomplicated: Secondary | ICD-10-CM | POA: Diagnosis not present

## 2022-10-12 DIAGNOSIS — I10 Essential (primary) hypertension: Secondary | ICD-10-CM | POA: Diagnosis not present

## 2022-10-12 DIAGNOSIS — R030 Elevated blood-pressure reading, without diagnosis of hypertension: Secondary | ICD-10-CM | POA: Diagnosis not present

## 2022-10-12 DIAGNOSIS — Z6827 Body mass index (BMI) 27.0-27.9, adult: Secondary | ICD-10-CM | POA: Diagnosis not present

## 2023-01-14 DIAGNOSIS — E559 Vitamin D deficiency, unspecified: Secondary | ICD-10-CM | POA: Diagnosis not present

## 2023-01-14 DIAGNOSIS — I10 Essential (primary) hypertension: Secondary | ICD-10-CM | POA: Diagnosis not present

## 2023-01-14 DIAGNOSIS — R030 Elevated blood-pressure reading, without diagnosis of hypertension: Secondary | ICD-10-CM | POA: Diagnosis not present

## 2023-01-14 DIAGNOSIS — E789 Disorder of lipoprotein metabolism, unspecified: Secondary | ICD-10-CM | POA: Diagnosis not present

## 2023-01-14 DIAGNOSIS — F17210 Nicotine dependence, cigarettes, uncomplicated: Secondary | ICD-10-CM | POA: Diagnosis not present

## 2023-02-18 DIAGNOSIS — E559 Vitamin D deficiency, unspecified: Secondary | ICD-10-CM | POA: Diagnosis not present

## 2023-02-18 DIAGNOSIS — I10 Essential (primary) hypertension: Secondary | ICD-10-CM | POA: Diagnosis not present

## 2023-02-18 DIAGNOSIS — R030 Elevated blood-pressure reading, without diagnosis of hypertension: Secondary | ICD-10-CM | POA: Diagnosis not present

## 2023-02-18 DIAGNOSIS — Z6827 Body mass index (BMI) 27.0-27.9, adult: Secondary | ICD-10-CM | POA: Diagnosis not present

## 2023-02-18 DIAGNOSIS — E789 Disorder of lipoprotein metabolism, unspecified: Secondary | ICD-10-CM | POA: Diagnosis not present

## 2023-02-18 DIAGNOSIS — F17210 Nicotine dependence, cigarettes, uncomplicated: Secondary | ICD-10-CM | POA: Diagnosis not present

## 2023-07-18 DIAGNOSIS — F17210 Nicotine dependence, cigarettes, uncomplicated: Secondary | ICD-10-CM | POA: Diagnosis not present

## 2023-07-18 DIAGNOSIS — E789 Disorder of lipoprotein metabolism, unspecified: Secondary | ICD-10-CM | POA: Diagnosis not present

## 2023-07-18 DIAGNOSIS — Z114 Encounter for screening for human immunodeficiency virus [HIV]: Secondary | ICD-10-CM | POA: Diagnosis not present

## 2023-07-18 DIAGNOSIS — R5383 Other fatigue: Secondary | ICD-10-CM | POA: Diagnosis not present

## 2023-07-18 DIAGNOSIS — Z1339 Encounter for screening examination for other mental health and behavioral disorders: Secondary | ICD-10-CM | POA: Diagnosis not present

## 2023-07-18 DIAGNOSIS — Z125 Encounter for screening for malignant neoplasm of prostate: Secondary | ICD-10-CM | POA: Diagnosis not present

## 2023-07-18 DIAGNOSIS — R0602 Shortness of breath: Secondary | ICD-10-CM | POA: Diagnosis not present

## 2023-07-18 DIAGNOSIS — I10 Essential (primary) hypertension: Secondary | ICD-10-CM | POA: Diagnosis not present

## 2023-07-18 DIAGNOSIS — Z6827 Body mass index (BMI) 27.0-27.9, adult: Secondary | ICD-10-CM | POA: Diagnosis not present

## 2023-07-18 DIAGNOSIS — Z13220 Encounter for screening for lipoid disorders: Secondary | ICD-10-CM | POA: Diagnosis not present

## 2023-07-18 DIAGNOSIS — E559 Vitamin D deficiency, unspecified: Secondary | ICD-10-CM | POA: Diagnosis not present

## 2023-07-18 DIAGNOSIS — Z0000 Encounter for general adult medical examination without abnormal findings: Secondary | ICD-10-CM | POA: Diagnosis not present

## 2023-07-18 DIAGNOSIS — R030 Elevated blood-pressure reading, without diagnosis of hypertension: Secondary | ICD-10-CM | POA: Diagnosis not present

## 2023-07-18 DIAGNOSIS — Z122 Encounter for screening for malignant neoplasm of respiratory organs: Secondary | ICD-10-CM | POA: Diagnosis not present

## 2023-07-18 DIAGNOSIS — Z131 Encounter for screening for diabetes mellitus: Secondary | ICD-10-CM | POA: Diagnosis not present

## 2023-07-18 DIAGNOSIS — Z1331 Encounter for screening for depression: Secondary | ICD-10-CM | POA: Diagnosis not present

## 2024-07-30 DIAGNOSIS — F17210 Nicotine dependence, cigarettes, uncomplicated: Secondary | ICD-10-CM | POA: Diagnosis not present

## 2024-08-06 ENCOUNTER — Ambulatory Visit: Attending: Internal Medicine | Admitting: Audiology

## 2024-08-06 DIAGNOSIS — H9313 Tinnitus, bilateral: Secondary | ICD-10-CM | POA: Insufficient documentation

## 2024-08-06 DIAGNOSIS — H903 Sensorineural hearing loss, bilateral: Secondary | ICD-10-CM | POA: Insufficient documentation

## 2024-08-06 NOTE — Procedures (Signed)
  Outpatient Audiology and [facility] [address] [place], [place]  [address] [phone]  AUDIOLOGICAL  EVALUATION  NAME: Fred Weaver     DOB:   1966-07-15      MRN: [id number]                                                                                     DATE: 08/06/2024     REFERENT: Alphonsa Glendia LABOR, MD STATUS: Outpatient DIAGNOSIS: tinnitus, sensorineural hearing loss   History: [name] was seen for an audiological evaluation due to concerns regarding his left tinnitus. Saba reports he has left sided tinnitus for many years. [name] reports a few years ago he had pain and sudden hearing loss and tinnitus in the left ear. He reports the hearing sensitivity returned in the left ear however the tinnitus stayed. He reports the tinnitus is constant and bothersome. [name] denies otalgia, aural fullness, dizziness, and hearing concerns.   Evaluation:  Otoscopy showed a clear view of the tympanic membranes, bilaterally Tympanometry results were consistent with normal middle ear pressure and normal tympanic membrane mobility in both ears.  Audiometric testing was completed using Conventional Audiometry techniques with insert earphones and TDH headphones. Test results are consistent with normal hearing sensitivity sloping to a moderately-severe sensorineural hearing loss, bilaterally. There is an asymmetry noted at 2000-6000 Hz, worse in the left ear. Speech Recognition Thresholds were obtained at 20 dB HL in the right ear and at 20  dB HL in the left ear. Word Recognition Testing was completed at 60 dB HL and [name] scored 100%, bilaterally.    Results:  The test results were reviewed with [name]. Test results are consistent with normal hearing sensitivity sloping to a moderately-severe sensorineural hearing loss, bilaterally. There is an asymmetry noted at 2000-6000 Hz, worse in the left ear. [name] may have hearing and communication difficulty in adverse  listening environments. He will benefit from the use of good communication strategies. [name] will benefit form the use of amplification if motivated. Tinnitus management strategies were reviewed. [name] was given handouts on tinnitus management strategies. A referral to an Ear, Nose, and Throat Physician was reviewed due to history of left sided tinnitus and asymmetric sensorineural hearing loss.   Recommendations: Referral to an Ear, Nose, and Throat Physician for asymmetric sensorineural hearing loss and left sided tinnitus.  Monitor hearing sensitivity   30 minutes spent testing and counseling on results.   If you have any questions please feel free to contact me at [phone].  Darryle Posey Audiologist, Au.D., CCC-A 08/06/2024  1:20 PM  Cc: Alphonsa Glendia LABOR, MD

## 2024-08-28 ENCOUNTER — Encounter: Payer: Self-pay | Admitting: Internal Medicine

## 2024-09-09 ENCOUNTER — Encounter: Payer: Self-pay | Admitting: Gastroenterology

## 2024-09-11 ENCOUNTER — Ambulatory Visit (INDEPENDENT_AMBULATORY_CARE_PROVIDER_SITE_OTHER): Admitting: Physician Assistant

## 2024-09-11 ENCOUNTER — Encounter (INDEPENDENT_AMBULATORY_CARE_PROVIDER_SITE_OTHER): Payer: Self-pay | Admitting: Physician Assistant

## 2024-09-11 VITALS — BP 127/78 | HR 74 | Ht 72.0 in | Wt 205.0 lb

## 2024-09-11 DIAGNOSIS — H9319 Tinnitus, unspecified ear: Secondary | ICD-10-CM

## 2024-09-11 DIAGNOSIS — H9042 Sensorineural hearing loss, unilateral, left ear, with unrestricted hearing on the contralateral side: Secondary | ICD-10-CM

## 2024-09-11 DIAGNOSIS — H903 Sensorineural hearing loss, bilateral: Secondary | ICD-10-CM

## 2024-09-11 NOTE — Patient Instructions (Signed)
 I have ordered an imaging study for you to complete prior to your next visit. Please call [facility] Scheduling at [phone] to schedule your imaging if you have not received a call within 24 hours. If you are unable to complete your imaging study prior to your next scheduled visit please call our office to let us  know.
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
## 2024-10-23 ENCOUNTER — Ambulatory Visit (AMBULATORY_SURGERY_CENTER)

## 2024-10-23 ENCOUNTER — Other Ambulatory Visit: Payer: Self-pay

## 2024-10-23 VITALS — Ht 72.0 in | Wt 205.0 lb

## 2024-10-23 DIAGNOSIS — Z860100 Personal history of colon polyps, unspecified: Secondary | ICD-10-CM

## 2024-10-23 MED ORDER — NA SULFATE-K SULFATE-MG SULF 17.5-3.13-1.6 GM/177ML PO SOLN: 1.0000 | Freq: Once | ORAL | 0 refills | Status: AC

## 2024-10-23 NOTE — Progress Notes (Signed)
 Denies allergies to eggs or soy products. Denies complication of anesthesia or sedation. Denies use of weight loss medication. Denies use of O2.   Emmi instructions given for colonoscopy.

## 2024-10-28 ENCOUNTER — Encounter: Payer: Self-pay | Admitting: Gastroenterology

## 2024-11-06 ENCOUNTER — Ambulatory Visit (AMBULATORY_SURGERY_CENTER): Admitting: Gastroenterology

## 2024-11-06 ENCOUNTER — Encounter: Payer: Self-pay | Admitting: Gastroenterology

## 2024-11-06 VITALS — BP 110/80 | HR 56 | Temp 97.8°F | Resp 16 | Ht 72.0 in | Wt 205.0 lb

## 2024-11-06 DIAGNOSIS — Z860101 Personal history of adenomatous and serrated colon polyps: Secondary | ICD-10-CM | POA: Diagnosis not present

## 2024-11-06 DIAGNOSIS — K644 Residual hemorrhoidal skin tags: Secondary | ICD-10-CM | POA: Diagnosis not present

## 2024-11-06 DIAGNOSIS — K5730 Diverticulosis of large intestine without perforation or abscess without bleeding: Secondary | ICD-10-CM

## 2024-11-06 DIAGNOSIS — K635 Polyp of colon: Secondary | ICD-10-CM | POA: Diagnosis not present

## 2024-11-06 DIAGNOSIS — K6289 Other specified diseases of anus and rectum: Secondary | ICD-10-CM

## 2024-11-06 DIAGNOSIS — Z1211 Encounter for screening for malignant neoplasm of colon: Secondary | ICD-10-CM

## 2024-11-06 DIAGNOSIS — Z860100 Personal history of colon polyps, unspecified: Secondary | ICD-10-CM

## 2024-11-06 DIAGNOSIS — K648 Other hemorrhoids: Secondary | ICD-10-CM

## 2024-11-06 DIAGNOSIS — D123 Benign neoplasm of transverse colon: Secondary | ICD-10-CM

## 2024-11-06 MED ORDER — SODIUM CHLORIDE 0.9 % IV SOLN: 500.0000 mL | Freq: Once | INTRAVENOUS | Status: DC
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
## 2024-11-06 NOTE — Patient Instructions (Signed)
Handouts provided about hemorrhoids, diverticulosis and polyps. Resume previous diet.  Continue present medications.  Await pathology results.  YOU HAD AN ENDOSCOPIC PROCEDURE TODAY AT THE [facility]:   Refer to the procedure report that was given to you for any specific questions about what was found during the examination.  If the procedure report does not answer your questions, please call your gastroenterologist to clarify.  If you requested that your care partner not be given the details of your procedure findings, then the procedure report has been included in a sealed envelope for you to review at your convenience later.  YOU SHOULD EXPECT: Some feelings of bloating in the abdomen. Passage of more gas than usual.  Walking can help get rid of the air that was put into your GI tract during the procedure and reduce the bloating. If you had a lower endoscopy (such as a colonoscopy or flexible sigmoidoscopy) you may notice spotting of blood in your stool or on the toilet paper. If you underwent a bowel prep for your procedure, you may not have a normal bowel movement for a few days.  Please Note:  You might notice some irritation and congestion in your nose or some drainage.  This is from the oxygen used during your procedure.  There is no need for concern and it should clear up in a day or so.  SYMPTOMS TO REPORT IMMEDIATELY:  Following lower endoscopy (colonoscopy or flexible sigmoidoscopy):  Excessive amounts of blood in the stool  Significant tenderness or worsening of abdominal pains  Swelling of the abdomen that is new, acute  Fever of 100F or higher   For urgent or emergent issues, a gastroenterologist can be reached at any hour by calling [phone]. Do not use MyChart messaging for urgent concerns.    DIET:  We do recommend a small meal at first, but then you may proceed to your regular diet.  Drink plenty of fluids but you should avoid alcoholic beverages for 24  hours.  ACTIVITY:  You should plan to take it easy for the rest of today and you should NOT DRIVE or use heavy machinery until tomorrow (because of the sedation medicines used during the test).    FOLLOW UP: Our staff will call the number listed on your records the next business day following your procedure.  We will call around 7:15- 8:00 am to check on you and address any questions or concerns that you may have regarding the information given to you following your procedure. If we do not reach you, we will leave a message.     If any biopsies were taken you will be contacted by phone or by letter within the next 1-3 weeks.  Please call us at [phone] if you have not heard about the biopsies in 3 weeks.    SIGNATURES/CONFIDENTIALITY: You and/or your care partner have signed paperwork which will be entered into your electronic medical record.  These signatures attest to the fact that that the information above on your After Visit Summary has been reviewed and is understood.  Full responsibility of the confidentiality of this discharge information lies with you and/or your care-partner.

## 2024-11-06 NOTE — Op Note (Signed)
 [facility] Patient Name: Fred Weaver Procedure Date: 11/06/2024 3:21 PM MRN: [id number] Endoscopist: Elspeth P. Leigh , MD, [id number] Age: 58 Referring MD:  Date of Birth: [birth year] Gender: Male Account #: [id number] Procedure:                Colonoscopy Indications:              High risk colon cancer surveillance: Personal                            history of colonic polyps - one adenoma removed                            07/2017 Medicines:                Monitored Anesthesia Care Procedure:                Pre-Anesthesia Assessment:                           - Prior to the procedure, a History and Physical                            was performed, and patient medications and                            allergies were reviewed. The patient's tolerance of                            previous anesthesia was also reviewed. The risks                            and benefits of the procedure and the sedation                            options and risks were discussed with the patient.                            All questions were answered, and informed consent                            was obtained. Prior Anticoagulants: The patient has                            taken no anticoagulant or antiplatelet agents. ASA                            Grade Assessment: II - A patient with mild systemic                            disease. After reviewing the risks and benefits,                            the patient was deemed in satisfactory condition to  undergo the procedure.                           After obtaining informed consent, the colonoscope                            was passed under direct vision. Throughout the                            procedure, the patient's blood pressure, pulse, and                            oxygen saturations were monitored continuously. The                            Olympus Scope SN: [id number] was introduced through                             the anus and advanced to the the cecum, identified                            by appendiceal orifice and ileocecal valve. The                            colonoscopy was performed without difficulty. The                            patient tolerated the procedure well. The quality                            of the bowel preparation was adequate. The                            ileocecal valve, appendiceal orifice, and rectum                            were photographed. Scope In: 3:36:58 PM Scope Out: 3:56:51 PM Scope Withdrawal Time: 0 hours 16 minutes 50 seconds  Total Procedure Duration: 0 hours 19 minutes 53 seconds  Findings:                 Skin tags were found on perianal exam.                           Multiple diverticula were found in the right colon.                           Five sessile polyps were found in the transverse                            colon. The polyps were 2 to 5 mm in size. These                            polyps were removed with a cold snare. Resection  and retrieval were complete.                           Anal papilla(e) were hypertrophied.                           Internal hemorrhoids were found during                            retroflexion. The hemorrhoids were small.                           The exam was otherwise without abnormality.                            Residual liquid stool noted throughout which led to                            significant lavage but adequate views were obtained. Complications:            No immediate complications. Estimated blood loss:                            Minimal. Estimated Blood Loss:     Estimated blood loss was minimal. Impression:               - Perianal skin tags found on perianal exam.                           - Diverticulosis in the right colon.                           - Five 2 to 5 mm polyps in the transverse colon,                            removed  with a cold snare. Resected and retrieved.                           - Anal papilla(e) were hypertrophied.                           - Internal hemorrhoids.                           - The examination was otherwise normal. Recommendation:           - Patient has a contact number available for                            emergencies. The signs and symptoms of potential                            delayed complications were discussed with the                            patient. Return to normal activities tomorrow.  Written discharge instructions were provided to the                            patient.                           - Resume previous diet.                           - Continue present medications.                           - Await pathology results. Elspeth P. Beyounce Dickens, MD 11/06/2024 4:02:08 PM This report has been signed electronically.

## 2024-11-06 NOTE — Progress Notes (Signed)
 To pacu, VSS. Report to [email or web address]

## 2024-11-06 NOTE — Progress Notes (Signed)
Pt. states no medical or surgical changes since previsit or office visit. 

## 2024-11-06 NOTE — Progress Notes (Signed)
 Called to room to assist during endoscopic procedure.  Patient ID and intended procedure confirmed with present staff. Received instructions for my participation in the procedure from the performing physician.

## 2024-11-09 ENCOUNTER — Telehealth: Payer: Self-pay

## 2024-11-09 NOTE — Telephone Encounter (Signed)
  Follow up Call-     11/06/2024    2:48 PM  Call back number  Post procedure Call Back phone  # [phone]  Permission to leave phone message Yes     Patient questions:  Do you have a fever, pain , or abdominal swelling? No. Pain Score  0 *  Have you tolerated food without any problems? Yes.    Have you been able to return to your normal activities? Yes.    Do you have any questions about your discharge instructions: Diet   No. Medications  No. Follow up visit  No.  Do you have questions or concerns about your Care? No.  Actions: * If pain score is 4 or above: No action needed, pain <4.

## 2024-11-11 ENCOUNTER — Ambulatory Visit: Payer: Self-pay | Admitting: Gastroenterology

## 2024-11-11 LAB — SURGICAL PATHOLOGY
